# Patient Record
Sex: Male | Born: 1995 | Race: Black or African American | Hispanic: No | Marital: Single | Smoking: Never smoker
Health system: Southern US, Community
[De-identification: ages and names within clinical notes are randomized; demographics above are authoritative.]

## PROBLEM LIST (undated history)

## (undated) DIAGNOSIS — J45909 Unspecified asthma, uncomplicated: Secondary | ICD-10-CM

## (undated) DIAGNOSIS — J302 Other seasonal allergic rhinitis: Secondary | ICD-10-CM

## (undated) DIAGNOSIS — T7840XA Allergy, unspecified, initial encounter: Secondary | ICD-10-CM

## (undated) HISTORY — DX: Other seasonal allergic rhinitis: J30.2

## (undated) HISTORY — DX: Unspecified asthma, uncomplicated: J45.909

## (undated) HISTORY — DX: Allergy, unspecified, initial encounter: T78.40XA

---

## 2015-01-10 ENCOUNTER — Emergency Department (HOSPITAL_COMMUNITY)
Admission: EM | Admit: 2015-01-10 | Discharge: 2015-01-10 | Disposition: A | Discharge status: Home / Self Care | Payer: Managed Care, Other (non HMO) | Source: Home / Self Care | Attending: Family Medicine | Admitting: Family Medicine

## 2015-01-10 ENCOUNTER — Encounter (HOSPITAL_COMMUNITY): Payer: Self-pay | Admitting: Emergency Medicine

## 2015-01-10 DIAGNOSIS — R1084 Generalized abdominal pain: Secondary | ICD-10-CM

## 2015-01-10 DIAGNOSIS — R197 Diarrhea, unspecified: Secondary | ICD-10-CM | POA: Diagnosis not present

## 2015-01-10 NOTE — ED Provider Notes (Signed)
CSN: [id number]     Arrival date & time 01/10/15  0801 History   First MD Initiated Contact with Patient 01/10/15 [phone]     Chief Complaint  Patient presents with  . Abdominal Pain   (Consider location/radiation/quality/duration/timing/severity/associated sxs/prior Treatment) HPI          19 year old male presents for evaluation of abdominal pain and diarrhea. This started yesterday. Had mid abdominal pain and a few episodes of watery diarrhea. No nausea or vomiting, fever, or any other systemic symptoms. Pain was 8 out of 10 yesterday but is down to 5 out of 10 today. No blood in the diarrhea. No recent antibiotic use or hospitalizations. No medications taken for treatment. Denies urinary symptoms  History reviewed. No pertinent past medical history. History reviewed. No pertinent past surgical history. History reviewed. No pertinent family history. History  Substance Use Topics  . Smoking status: Never Smoker   . Smokeless tobacco: Not on file  . Alcohol Use: No    Review of Systems  Constitutional: Negative for fever.  Gastrointestinal: Positive for abdominal pain and diarrhea. Negative for nausea and vomiting.  All other systems reviewed and are negative.   Allergies  Review of patient's allergies indicates no known allergies.  Home Medications   Prior to Admission medications   Not on File   BP 116/82 mmHg  Pulse 79  Temp(Src) 97.7 F (36.5 C) (Oral)  Resp 16  SpO2 99% Physical Exam  Constitutional: He is oriented to person, place, and time. He appears well-developed and well-nourished. No distress.  HENT:  Head: Normocephalic.  Pulmonary/Chest: Effort normal. No respiratory distress.  Abdominal: Soft. Bowel sounds are normal. He exhibits no distension and no mass. There is tenderness ( very mild) in the periumbilical area. There is no rebound and no guarding.  Neurological: He is alert and oriented to person, place, and time. Coordination normal.  Skin: Skin is  warm and dry. No rash noted. He is not diaphoretic.  Psychiatric: He has a normal mood and affect. Judgment normal.  Nursing note and vitals reviewed.   ED Course  Procedures (including critical care time) Labs Review Labs Reviewed - No data to display  Imaging Review No results found.   MDM   1. Abdominal pain, generalized   2. Diarrhea    He is improving without treatment. Increase fluids, watchful waiting, he may start Imodium if the diarrhea gets worse. He will return if the pain gets any worse. For now, his abdomen is soft and minimally tender and he is in no distress, no further workup indicated at this time     Liam Graham, PA-C 01/10/15 [phone]

## 2015-01-10 NOTE — ED Notes (Signed)
C/o abdominal that is centered above the navel.  2 loose stools.  On set yesterday morning.  Denies n/v.   Pt has tried pepto with no relief. No fever.

## 2015-01-10 NOTE — Discharge Instructions (Signed)
Abdominal Pain °Many things can cause abdominal pain. Usually, abdominal pain is not caused by a disease and will improve without treatment. It can often be observed and treated at home. Your health care provider will do a physical exam and possibly order blood tests and X-rays to help determine the seriousness of your pain. However, in many cases, more time must pass before a clear cause of the pain can be found. Before that point, your health care provider may not know if you need more testing or further treatment. °HOME CARE INSTRUCTIONS  °Monitor your abdominal pain for any changes. The following actions may help to alleviate any discomfort you are experiencing: °· Only take over-the-counter or prescription medicines as directed by your health care provider. °· Do not take laxatives unless directed to do so by your health care provider. °· Try a clear liquid diet (broth, tea, or water) as directed by your health care provider. Slowly move to a bland diet as tolerated. °SEEK MEDICAL CARE IF: °· You have unexplained abdominal pain. °· You have abdominal pain associated with nausea or diarrhea. °· You have pain when you urinate or have a bowel movement. °· You experience abdominal pain that wakes you in the night. °· You have abdominal pain that is worsened or improved by eating food. °· You have abdominal pain that is worsened with eating fatty foods. °· You have a fever. °SEEK IMMEDIATE MEDICAL CARE IF:  °· Your pain does not go away within 2 hours. °· You keep throwing up (vomiting). °· Your pain is felt only in portions of the abdomen, such as the right side or the left lower portion of the abdomen. °· You pass bloody or black tarry stools. °MAKE SURE YOU: °· Understand these instructions.   °· Will watch your condition.   °· Will get help right away if you are not doing well or get worse.   °Document Released: 08/14/2005 Document Revised: 11/09/2013 Document Reviewed: 07/14/2013 °[employer]® Patient Information  ©2015 [employer]. This information is not intended to replace advice given to you by your health care provider. Make sure you discuss any questions you have with your health care provider. ° °Diarrhea °Diarrhea is frequent loose and watery bowel movements. It can cause you to feel weak and dehydrated. Dehydration can cause you to become tired and thirsty, have a dry mouth, and have decreased urination that often is dark yellow. Diarrhea is a sign of another problem, most often an infection that will not last long. In most cases, diarrhea typically lasts 2-3 days. However, it can last longer if it is a sign of something more serious. It is important to treat your diarrhea as directed by your caregiver to lessen or prevent future episodes of diarrhea. °CAUSES  °Some common causes include: °· Gastrointestinal infections caused by viruses, bacteria, or parasites. °· Food poisoning or food allergies. °· Certain medicines, such as antibiotics, chemotherapy, and laxatives. °· Artificial sweeteners and fructose. °· Digestive disorders. °HOME CARE INSTRUCTIONS °· Ensure adequate fluid intake (hydration): Have 1 cup (8 oz) of fluid for each diarrhea episode. Avoid fluids that contain simple sugars or sports drinks, fruit juices, whole milk products, and sodas. Your urine should be clear or pale yellow if you are drinking enough fluids. Hydrate with an oral rehydration solution that you can purchase at pharmacies, retail stores, and online. You can prepare an oral rehydration solution at home by mixing the following ingredients together: °¨  - tsp table salt. °¨ ¾ tsp baking soda. °¨    tsp salt substitute containing potassium chloride.  1  tablespoons sugar.  1 L (34 oz) of water.  Certain foods and beverages may increase the speed at which food moves through the gastrointestinal (GI) tract. These foods and beverages should be avoided and include:  Caffeinated and alcoholic beverages.  High-fiber foods, such as raw  fruits and vegetables, nuts, seeds, and whole grain breads and cereals.  Foods and beverages sweetened with sugar alcohols, such as xylitol, sorbitol, and mannitol.  Some foods may be well tolerated and may help thicken stool including:  Starchy foods, such as rice, toast, pasta, low-sugar cereal, oatmeal, grits, baked potatoes, crackers, and bagels.  Bananas.  Applesauce.  Add probiotic-rich foods to help increase healthy bacteria in the GI tract, such as yogurt and fermented milk products.  Wash your hands well after each diarrhea episode.  Only take over-the-counter or prescription medicines as directed by your caregiver.  Take a warm bath to relieve any burning or pain from frequent diarrhea episodes. SEEK IMMEDIATE MEDICAL CARE IF:   You are unable to keep fluids down.  You have persistent vomiting.  You have blood in your stool, or your stools are black and tarry.  You do not urinate in 6-8 hours, or there is only a small amount of very dark urine.  You have abdominal pain that increases or localizes.  You have weakness, dizziness, confusion, or light-headedness.  You have a severe headache.  Your diarrhea gets worse or does not get better.  You have a fever or persistent symptoms for more than 2-3 days.  You have a fever and your symptoms suddenly get worse. MAKE SURE YOU:   Understand these instructions.  Will watch your condition.  Will get help right away if you are not doing well or get worse. Document Released: 10/25/2002 Document Revised: 03/21/2014 Document Reviewed: 07/12/2012 [facility] Patient Information 2015 [place], [place]. This information is not intended to replace advice given to you by your health care provider. Make sure you discuss any questions you have with your health care provider.  Food Choices to Help Relieve Diarrhea When you have diarrhea, the foods you eat and your eating habits are very important. Choosing the right foods and drinks  can help relieve diarrhea. Also, because diarrhea can last up to 7 days, you need to replace lost fluids and electrolytes (such as sodium, potassium, and chloride) in order to help prevent dehydration.  WHAT GENERAL GUIDELINES DO I NEED TO FOLLOW?  Slowly drink 1 cup (8 oz) of fluid for each episode of diarrhea. If you are getting enough fluid, your urine will be clear or pale yellow.  Eat starchy foods. Some good choices include white rice, white toast, pasta, low-fiber cereal, baked potatoes (without the skin), saltine crackers, and bagels.  Avoid large servings of any cooked vegetables.  Limit fruit to two servings per day. A serving is  cup or 1 small piece.  Choose foods with less than 2 g of fiber per serving.  Limit fats to less than 8 tsp (38 g) per day.  Avoid fried foods.  Eat foods that have probiotics in them. Probiotics can be found in certain dairy products.  Avoid foods and beverages that may increase the speed at which food moves through the stomach and intestines (gastrointestinal tract). Things to avoid include:  High-fiber foods, such as dried fruit, raw fruits and vegetables, nuts, seeds, and whole grain foods.  Spicy foods and high-fat foods.  Foods and beverages sweetened with high-fructose  corn syrup, honey, or sugar alcohols such as xylitol, sorbitol, and mannitol. WHAT FOODS ARE RECOMMENDED? Grains White rice. White, [place], or pita breads (fresh or toasted), including plain rolls, buns, or bagels. White pasta. Saltine, soda, or graham crackers. Pretzels. Low-fiber cereal. Cooked cereals made with water (such as cornmeal, farina, or cream cereals). Plain muffins. Matzo. Melba toast. Zwieback.  Vegetables Potatoes (without the skin). Strained tomato and vegetable juices. Most well-cooked and canned vegetables without seeds. Tender lettuce. Fruits Cooked or canned applesauce, apricots, cherries, fruit cocktail, grapefruit, peaches, pears, or plums. Fresh  bananas, apples without skin, cherries, grapes, cantaloupe, grapefruit, peaches, oranges, or plums.  Meat and Other Protein Products Baked or boiled chicken. Eggs. Tofu. Fish. Seafood. Smooth peanut butter. Ground or well-cooked tender beef, ham, veal, lamb, pork, or poultry.  Dairy Plain yogurt, kefir, and unsweetened liquid yogurt. Lactose-free milk, buttermilk, or soy milk. Plain hard cheese. Beverages Sport drinks. Clear broths. Diluted fruit juices (except prune). Regular, caffeine-free sodas such as ginger ale. Water. Decaffeinated teas. Oral rehydration solutions. Sugar-free beverages not sweetened with sugar alcohols. Other Bouillon, broth, or soups made from recommended foods.  The items listed above may not be a complete list of recommended foods or beverages. Contact your dietitian for more options. WHAT FOODS ARE NOT RECOMMENDED? Grains Whole grain, whole wheat, bran, or rye breads, rolls, pastas, crackers, and cereals. Wild or brown rice. Cereals that contain more than 2 g of fiber per serving. Corn tortillas or taco shells. Cooked or dry oatmeal. Granola. Popcorn. Vegetables Raw vegetables. Cabbage, broccoli, Brussels sprouts, artichokes, baked beans, beet greens, corn, kale, legumes, peas, sweet potatoes, and yams. Potato skins. Cooked spinach and cabbage. Fruits Dried fruit, including raisins and dates. Raw fruits. Stewed or dried prunes. Fresh apples with skin, apricots, mangoes, pears, raspberries, and strawberries.  Meat and Other Protein Products Chunky peanut butter. Nuts and seeds. Beans and lentils. Berniece Salines.  Dairy High-fat cheeses. Milk, chocolate milk, and beverages made with milk, such as milk shakes. Cream. Ice cream. Sweets and Desserts Sweet rolls, doughnuts, and sweet breads. Pancakes and waffles. Fats and Oils Butter. Cream sauces. Margarine. Salad oils. Plain salad dressings. Olives. Avocados.  Beverages Caffeinated beverages (such as coffee, tea, soda, or  energy drinks). Alcoholic beverages. Fruit juices with pulp. Prune juice. Soft drinks sweetened with high-fructose corn syrup or sugar alcohols. Other Coconut. Hot sauce. Chili powder. Mayonnaise. Gravy. Cream-based or milk-based soups.  The items listed above may not be a complete list of foods and beverages to avoid. Contact your dietitian for more information. WHAT SHOULD I DO IF I BECOME DEHYDRATED? Diarrhea can sometimes lead to dehydration. Signs of dehydration include dark urine and dry mouth and skin. If you think you are dehydrated, you should rehydrate with an oral rehydration solution. These solutions can be purchased at pharmacies, retail stores, or online.  Drink -1 cup (120-240 mL) of oral rehydration solution each time you have an episode of diarrhea. If drinking this amount makes your diarrhea worse, try drinking smaller amounts more often. For example, drink 1-3 tsp (5-15 mL) every 5-10 minutes.  A general rule for staying hydrated is to drink 1-2 L of fluid per day. Talk to your health care provider about the specific amount you should be drinking each day. Drink enough fluids to keep your urine clear or pale yellow. Document Released: 01/25/2004 Document Revised: 11/09/2013 Document Reviewed: 09/27/2013 [facility] Patient Information 2015 [place], [place]. This information is not intended to replace advice given to you by your  health care provider. Make sure you discuss any questions you have with your health care provider. ° °

## 2016-02-24 ENCOUNTER — Encounter (HOSPITAL_COMMUNITY): Payer: Self-pay | Admitting: Emergency Medicine

## 2016-02-24 ENCOUNTER — Ambulatory Visit (HOSPITAL_COMMUNITY)
Admission: EM | Admit: 2016-02-24 | Discharge: 2016-02-24 | Disposition: A | Discharge status: Home / Self Care | Payer: Managed Care, Other (non HMO) | Attending: Family Medicine | Admitting: Family Medicine

## 2016-02-24 DIAGNOSIS — R059 Cough, unspecified: Secondary | ICD-10-CM

## 2016-02-24 DIAGNOSIS — R0982 Postnasal drip: Secondary | ICD-10-CM | POA: Diagnosis not present

## 2016-02-24 DIAGNOSIS — J3489 Other specified disorders of nose and nasal sinuses: Secondary | ICD-10-CM

## 2016-02-24 DIAGNOSIS — R05 Cough: Secondary | ICD-10-CM | POA: Diagnosis not present

## 2016-02-24 NOTE — ED Notes (Signed)
C/o cold sx onset x1 month associated w/cough and prod... Denies fevers... A&O x4... No acute distress.

## 2016-02-24 NOTE — ED Provider Notes (Signed)
CSN: [id number]     Arrival date & time 02/24/16  1802 History   First MD Initiated Contact with Patient 02/24/16 1942     Chief Complaint  Patient presents with  . URI   (Consider location/radiation/quality/duration/timing/severity/associated sxs/prior Treatment) HPI Comments: 20-year-old male complaining of a cough for one month. It is associated with PND. Denies fever, sore throat, shortness of breath, earache.  Patient is a 20 y.o. male presenting with URI.  URI Presenting symptoms: cough   Presenting symptoms: no congestion, no ear pain, no fatigue, no fever and no sore throat     History reviewed. No pertinent past medical history. History reviewed. No pertinent past surgical history. No family history on file. Social History  Substance Use Topics  . Smoking status: Never Smoker   . Smokeless tobacco: None  . Alcohol Use: No    Review of Systems  Constitutional: Negative for fever, chills, activity change and fatigue.  HENT: Positive for postnasal drip. Negative for congestion, ear discharge, ear pain, sinus pressure, sore throat and trouble swallowing.   Respiratory: Positive for cough. Negative for shortness of breath.   Cardiovascular: Negative.   Neurological: Negative.     Allergies  Review of patient's allergies indicates no known allergies.  Home Medications   Prior to Admission medications   Not on File   Meds Ordered and Administered this Visit  Medications - No data to display  BP 136/91 mmHg  Pulse 62  Temp(Src) 98.3 F (36.8 C) (Oral)  Resp 18  SpO2 100% No data found.   Physical Exam  Constitutional: He is oriented to person, place, and time. He appears well-developed and well-nourished. No distress.  HENT:  Mouth/Throat: No oropharyngeal exudate.  Bilateral TMs are normal Oropharynx with light scattered patches of erythema and scant clear PND. Patient is witnessed clearing his throat several times during the exam. No exudates or swelling.   Eyes: EOM are normal.  Neck: Normal range of motion. Neck supple.  Cardiovascular: Normal rate.   Pulmonary/Chest: Effort normal. No respiratory distress. He has no wheezes. He has no rales.  Musculoskeletal: He exhibits no edema.  Neurological: He is alert and oriented to person, place, and time. He exhibits normal muscle tone.  Skin: Skin is warm and dry.  Psychiatric: He has a normal mood and affect.  Nursing note and vitals reviewed.   ED Course  Procedures (including critical care time)  Labs Review Labs Reviewed - No data to display  Imaging Review No results found.   Visual Acuity Review  Right Eye Distance:   Left Eye Distance:   Bilateral Distance:    Right Eye Near:   Left Eye Near:    Bilateral Near:         MDM   1. Cough   2. PND (post-nasal drip)   3. Sinus drainage    Allergies Your cough is likely due to persistent drainage in the back of your throat. Recommend taking an antihistamine. The nondrowsy antihistamines include Zyrtec, Claritin and Allegra. If you need something stronger he may take Chlor-Trimeton 2 or 4 mg every 4 hours to help with drainage. This may cause some drowsiness. Using steroid nasal spray such as Nasacort or Flonase may also help with the drainage. Drink plenty of fluids, stay well-hydrated    Janne Napoleon, NP 02/24/16 1956

## 2016-02-24 NOTE — Discharge Instructions (Signed)
Allergies Your cough is likely due to persistent drainage in the back of your throat. Recommend taking an antihistamine. The nondrowsy antihistamines include Zyrtec, Claritin and Allegra. If you need something stronger he may take Chlor-Trimeton 2 or 4 mg every 4 hours to help with drainage. This may cause some drowsiness. Using steroid nasal spray such as Nasacort or Flonase may also help with the drainage. Drink plenty of fluids, stay well-hydrated An allergy is an abnormal reaction to a substance by the body's defense system (immune system). Allergies can develop at any age. WHAT CAUSES ALLERGIES? An allergic reaction happens when the immune system mistakenly reacts to a normally harmless substance, called an allergen, as if it were harmful. The immune system releases antibodies to fight the substance. Antibodies eventually release a chemical called histamine into the bloodstream. The release of histamine is meant to protect the body from infection, but it also causes discomfort. An allergic reaction can be triggered by:  Eating an allergen.  Inhaling an allergen.  Touching an allergen. WHAT TYPES OF ALLERGIES ARE THERE? There are many types of allergies. Common types include:  Seasonal allergies. People with this type of allergy are usually allergic to substances that are only present during certain seasons, such as molds and pollens.  Food allergies.  Drug allergies.  Insect allergies.  Animal dander allergies. WHAT ARE SYMPTOMS OF ALLERGIES? Possible allergy symptoms include:  Swelling of the lips, face, tongue, mouth, or throat.  Sneezing, coughing, or wheezing.  Nasal congestion.  Tingling in the mouth.  Rash.  Itching.  Itchy, red, swollen areas of skin (hives).  Watery eyes.  Vomiting.  Diarrhea.  Dizziness.  Lightheadedness.  Fainting.  Trouble breathing or swallowing.  Chest tightness.  Rapid heartbeat. HOW ARE ALLERGIES DIAGNOSED? Allergies  are diagnosed with a medical and family history and one or more of the following:  Skin tests.  Blood tests.  A food diary. A food diary is a record of all the foods and drinks you have in a day and of all the symptoms you experience.  The results of an elimination diet. An elimination diet involves eliminating foods from your diet and then adding them back in one by one to find out if a certain food causes an allergic reaction. HOW ARE ALLERGIES TREATED? There is no cure for allergies, but allergic reactions can be treated with medicine. Severe reactions usually need to be treated at a hospital. HOW CAN REACTIONS BE PREVENTED? The best way to prevent an allergic reaction is by avoiding the substance you are allergic to. Allergy shots and medicines can also help prevent reactions in some cases. People with severe allergic reactions may be able to prevent a life-threatening reaction called anaphylaxis with a medicine given right after exposure to the allergen.   This information is not intended to replace advice given to you by your health care provider. Make sure you discuss any questions you have with your health care provider.   Document Released: 01/28/2003 Document Revised: 11/25/2014 Document Reviewed: 08/16/2014 Elsevier Interactive Patient Education [employer].

## 2016-12-27 ENCOUNTER — Ambulatory Visit (INDEPENDENT_AMBULATORY_CARE_PROVIDER_SITE_OTHER): Payer: Managed Care, Other (non HMO) | Admitting: Internal Medicine

## 2016-12-27 ENCOUNTER — Encounter: Payer: Self-pay | Admitting: Internal Medicine

## 2016-12-27 ENCOUNTER — Other Ambulatory Visit (HOSPITAL_COMMUNITY)
Admission: RE | Admit: 2016-12-27 | Discharge: 2016-12-27 | Disposition: A | Discharge status: Home / Self Care | Payer: Managed Care, Other (non HMO) | Source: Ambulatory Visit | Attending: Family Medicine | Admitting: Family Medicine

## 2016-12-27 VITALS — BP 118/84 | HR 74 | Temp 98.0°F | Ht 67.4 in | Wt 147.2 lb

## 2016-12-27 DIAGNOSIS — J309 Allergic rhinitis, unspecified: Secondary | ICD-10-CM | POA: Insufficient documentation

## 2016-12-27 DIAGNOSIS — Z113 Encounter for screening for infections with a predominantly sexual mode of transmission: Secondary | ICD-10-CM | POA: Insufficient documentation

## 2016-12-27 DIAGNOSIS — J302 Other seasonal allergic rhinitis: Secondary | ICD-10-CM | POA: Diagnosis not present

## 2016-12-27 DIAGNOSIS — Z23 Encounter for immunization: Secondary | ICD-10-CM | POA: Diagnosis not present

## 2016-12-27 MED ORDER — FLUTICASONE PROPIONATE 50 MCG/ACT NA SUSP: 2.0000 | Freq: Every day | NASAL | 6 refills | Status: DC

## 2016-12-27 MED ORDER — CETIRIZINE HCL 10 MG PO TABS: 10.0000 mg | ORAL_TABLET | Freq: Every day | ORAL | 11 refills | Status: DC

## 2016-12-27 NOTE — Assessment & Plan Note (Signed)
Patient has had 23 male sexual partners in the last year. Denies any penile discharge, but notes "tingling" during urination a few weeks ago that resolved on its own. - Check HIV, RPR, gonorrhea, and chlamydia - Will call patient with results - Discussed the importance of using condoms with every sexual encounter

## 2016-12-27 NOTE — Patient Instructions (Addendum)
It was so nice to meet you!  I have prescribed Flonase (nasal spray) and Zyrtec. Please use the nasal spray daily if you can tolerate it. You should make sure you point the spray to the outside of your nostril. You should also take the Zyrtec daily.  I will call you with your lab results.  We will see you back in 1 year or earlier if needed.  -Dr. Brett Albino

## 2016-12-27 NOTE — Assessment & Plan Note (Signed)
Has had chronic congestion and postnasal drip for years. Worse when the seasons change. - Prescribed Flonase 2 sprays daily and Zyrtec 10mg  daily - Follow up if not improving

## 2016-12-27 NOTE — Progress Notes (Signed)
   [facility] Phone: [phone]  Subjective:  Antonio Decker is a 21 year old male presenting to clinic for a new patient appointment.   STD testing: Pt interested in having STD testing performed. See social history below. He denies any penile discharge or genital lesions. Had some tingling with urination a few weeks ago that resolved on its own.  Sinus Issues: Has been going on for years. Having congestion and post-nasal drip. Worse when seasons change. Has not tried any medications.  ROS: No shortness of breath, no chest pain, no abdominal pain, no lower extremity edema, no fevers, no chills.  Past Medical History- none  Past Surgical History- tonsillectomy at age 61, wisdom teeth at age 30  Family history-  -Maternal grandmother- diabetes, migraines -Paternal grandfather- cataracts -Father- migraines -Brother- asthma -Sister- asthma  Social history- Works at [employer] part-time, in the [employer], senior at [employer]. Sexually active with 5 male partners in the last year. Uses protection most of the time. Never smoker. Drinks alcohol 1-2 times per week has 2 drinks. No drug use.  Medications- none  Objective: BP 118/84 (BP Location: Right Arm, Patient Position: Sitting, Cuff Size: Normal)   Pulse 74   Temp 98 F (36.7 C) (Oral)   Ht 5' 7.4" (1.712 m)   Wt 147 lb 3.2 oz (66.8 kg)   SpO2 98%   BMI 22.78 kg/m  Gen: NAD, alert, cooperative with exam HEENT: NCAT, EOMI, MMM, TMs clear, oropharynx clear, nasal turbinates edematous Neck: FROM, supple, no cervical lymphadenopathy CV: RRR, no murmur Resp: CTABL, no wheezes, normal work of breathing GI: SNTND, BS present, no guarding or organomegaly Msk: No edema, warm, normal tone, moves UE/LE spontaneously Neuro: Alert and oriented, no gross deficits Skin: No rashes, no lesions Psych: Appropriate behavior  Assessment/Plan: Encounter for STD Screening: Patient has had 3 male  sexual partners in the last year. Denies any penile discharge, but notes "tingling" during urination a few weeks ago that resolved on its own. - Check HIV, RPR, gonorrhea, and chlamydia - Will call patient with results - Discussed the importance of using condoms with every sexual encounter  Allergic Rhinitis: Has had chronic congestion and postnasal drip for years. Worse when the seasons change. - Prescribed Flonase 2 sprays daily and Zyrtec 10mg  daily - Follow up if not improving   Hyman Bible, MD PGY-2

## 2016-12-28 LAB — HIV ANTIBODY (ROUTINE TESTING W REFLEX): HIV 1&2 Ab, 4th Generation: NONREACTIVE

## 2016-12-28 LAB — RPR

## 2016-12-30 LAB — URINE CYTOLOGY ANCILLARY ONLY
Chlamydia: POSITIVE — AB
Neisseria Gonorrhea: NEGATIVE

## 2016-12-31 ENCOUNTER — Telehealth: Payer: Self-pay | Admitting: Internal Medicine

## 2016-12-31 NOTE — Telephone Encounter (Signed)
Patient returning call to Dr. Brett Albino about test results.

## 2016-12-31 NOTE — Telephone Encounter (Signed)
Spoke with patient on the phone. He will call our clinic to schedule an appointment in nurse clinic.

## 2017-01-01 ENCOUNTER — Ambulatory Visit (INDEPENDENT_AMBULATORY_CARE_PROVIDER_SITE_OTHER): Payer: Managed Care, Other (non HMO) | Admitting: *Deleted

## 2017-01-01 DIAGNOSIS — A749 Chlamydial infection, unspecified: Secondary | ICD-10-CM

## 2017-01-01 MED ORDER — AZITHROMYCIN 500 MG PO TABS
1000.0000 mg | ORAL_TABLET | Freq: Once | ORAL | Status: AC
  Administered 2017-01-01: 1000 mg via ORAL

## 2017-01-01 NOTE — Progress Notes (Signed)
   Patient in nurse clinic for chlamydia treatment.  Patient advised no sex for 7-10 days or until partner has been tested/treated.  Patient to schedule a follow up visit in 2-3 months for re-screening.  Azithromycin 1 gm PO x 1 given; order by Dr. Brett Albino.  Communicable Disease Report form faxed to [facility].  Derl Barrow, RN

## 2017-09-01 ENCOUNTER — Encounter: Payer: Self-pay | Admitting: Physician Assistant

## 2017-09-01 ENCOUNTER — Ambulatory Visit (INDEPENDENT_AMBULATORY_CARE_PROVIDER_SITE_OTHER): Payer: Managed Care, Other (non HMO) | Admitting: Physician Assistant

## 2017-09-01 VITALS — BP 110/80 | HR 76 | Temp 98.3°F | Resp 16 | Ht 67.0 in | Wt 146.4 lb

## 2017-09-01 DIAGNOSIS — K439 Ventral hernia without obstruction or gangrene: Secondary | ICD-10-CM

## 2017-09-01 NOTE — Progress Notes (Signed)
   Antonio Decker  MRN: [id number] DOB: 1996-06-08  PCP: Dorise Hiss, PA-C  Subjective:  Pt is a 21 year old male who presents to clinic for abdominal pain x 3 days. Pain is in center of abdomen above belly button, "feels like something is poking out above my belly button." Pain is worse with movement. Has happened before in the same spot. "I ended up pushing on it and it went away".  Denies n/v, diarrhea, change in appetite, redness, swelling, fever, chills, increased temperature.  He is in the [employer] and has to do PT.   Review of Systems  Constitutional: Negative for chills and fever.  Cardiovascular: Negative for palpitations.  Gastrointestinal: Positive for abdominal pain. Negative for constipation, diarrhea, nausea and vomiting.  Skin: Negative for color change.    There are no active problems to display for this patient.   No current outpatient prescriptions on file prior to visit.   No current facility-administered medications on file prior to visit.     No Known Allergies   Objective:  BP 110/80   Pulse 76   Temp 98.3 F (36.8 C) (Oral)   Resp 16   Ht 5\' 7"  (1.702 m)   Wt 146 lb 6.4 oz (66.4 kg)   SpO2 98%   BMI 22.93 kg/m   Physical Exam  Constitutional: He is oriented to person, place, and time and well-developed, well-nourished, and in no distress. No distress.  Abdominal: Soft. Normal appearance and bowel sounds are normal. A hernia is present.    Neurological: He is alert and oriented to person, place, and time. GCS score is 15.  Skin: Skin is warm and dry.  Psychiatric: Mood, memory, affect and judgment normal.  Vitals reviewed.   Assessment and Plan :  1. Hernia of abdominal wall - Ambulatory referral to General Surgery - Unable to reduce abdominal hernia. No sign of incarceration. Advised pt to procede to the emergency department if symptoms worsen. 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   Mercer Pod,  PA-C  [facility] 09/01/2017 10:38 AM

## 2017-09-01 NOTE — Patient Instructions (Signed)
     IF you received an x-ray today, you will receive an invoice from [facility]. Please contact [facility] at [phone] with questions or concerns regarding your invoice.   IF you received labwork today, you will receive an invoice from [facility]. Please contact [facility] at [phone] with questions or concerns regarding your invoice.   Our billing staff will not be able to assist you with questions regarding bills from these companies.  You will be contacted with the lab results as soon as they are available. The fastest way to get your results is to activate your My Chart account. Instructions are located on the last page of this paperwork. If you have not heard from us regarding the results in 2 weeks, please contact this office.     

## 2017-11-19 ENCOUNTER — Other Ambulatory Visit: Payer: Self-pay

## 2017-11-19 ENCOUNTER — Encounter: Payer: Self-pay | Admitting: Physician Assistant

## 2017-11-19 ENCOUNTER — Ambulatory Visit: Payer: BC Managed Care – PPO | Admitting: Physician Assistant

## 2017-11-19 VITALS — BP 120/86 | HR 67 | Temp 98.7°F | Resp 18 | Ht 69.09 in | Wt 149.6 lb

## 2017-11-19 DIAGNOSIS — J0100 Acute maxillary sinusitis, unspecified: Secondary | ICD-10-CM

## 2017-11-19 DIAGNOSIS — J309 Allergic rhinitis, unspecified: Secondary | ICD-10-CM | POA: Diagnosis not present

## 2017-11-19 MED ORDER — AMOXICILLIN-POT CLAVULANATE 875-125 MG PO TABS: 1.0000 | ORAL_TABLET | Freq: Two times a day (BID) | ORAL | 0 refills | Status: AC

## 2017-11-19 MED ORDER — CETIRIZINE HCL 10 MG PO TABS: 10.0000 mg | ORAL_TABLET | Freq: Every day | ORAL | 11 refills | Status: DC

## 2017-11-19 MED ORDER — PSEUDOEPHEDRINE HCL 60 MG PO TABS: 60.0000 mg | ORAL_TABLET | Freq: Four times a day (QID) | ORAL | 0 refills | Status: DC | PRN

## 2017-11-19 NOTE — Patient Instructions (Addendum)
For sinus issues, I recommend you do daily Zyrtec and Flonase.  For the times when he starts to feel increased head pressure, please use Sudafed as prescribed.  Sudafed may increase your heart rate, pressure, and cause insomnia.  Due to the fact that you are having worsening sinus pressure, we are going to treat empirically for underlying sinus infection.  Please take antibiotics as prescribed.  Continue with dailySudafed for the next few days while on the antibiotic.  I also recommend doing nasal saline rinses.  For the future, I have given you a referral for allergy specialist to have allergy testing.   they should contact you within 1-2 weeks.  If you do not hear from them by then please contact our office. Thank you for letting me participate in your health and well being.  Sinusitis, Adult Sinusitis is soreness and inflammation of your sinuses. Sinuses are hollow spaces in the bones around your face. They are located:  Around your eyes.  In the middle of your forehead.  Behind your nose.  In your cheekbones.  Your sinuses and nasal passages are lined with a stringy fluid (mucus). Mucus normally drains out of your sinuses. When your nasal tissues get inflamed or swollen, the mucus can get trapped or blocked so air cannot flow through your sinuses. This lets bacteria, viruses, and funguses grow, and that leads to infection. Follow these instructions at home: Medicines  Take, use, or apply over-the-counter and prescription medicines only as told by your doctor. These may include nasal sprays.  If you were prescribed an antibiotic medicine, take it as told by your doctor. Do not stop taking the antibiotic even if you start to feel better. Hydrate and Humidify  Drink enough water to keep your pee (urine) clear or pale yellow.  Use a cool mist humidifier to keep the humidity level in your home above 50%.  Breathe in steam for 10-15 minutes, 3-4 times a day or as told by your doctor. You can  do this in the bathroom while a hot shower is running.  Try not to spend time in cool or dry air. Rest  Rest as much as possible.  Sleep with your head raised (elevated).  Make sure to get enough sleep each night. General instructions  Put a warm, moist washcloth on your face 3-4 times a day or as told by your doctor. This will help with discomfort.  Wash your hands often with soap and water. If there is no soap and water, use hand sanitizer.  Do not smoke. Avoid being around people who are smoking (secondhand smoke).  Keep all follow-up visits as told by your doctor. This is important. Contact a doctor if:  You have a fever.  Your symptoms get worse.  Your symptoms do not get better within 10 days. Get help right away if:  You have a very bad headache.  You cannot stop throwing up (vomiting).  You have pain or swelling around your face or eyes.  You have trouble seeing.  You feel confused.  Your neck is stiff.  You have trouble breathing. This information is not intended to replace advice given to you by your health care provider. Make sure you discuss any questions you have with your health care provider. Document Released: 04/22/2008 Document Revised: 06/30/2016 Document Reviewed: 08/30/2015 Elsevier Interactive Patient Education  2018 [employer].   IF you received an x-ray today, you will receive an invoice from [facility]. Please contact [facility] at [phone]  with questions or concerns regarding your invoice.   IF you received labwork today, you will receive an invoice from [facility]. Please contact [facility] at [phone] with questions or concerns regarding your invoice.   Our billing staff will not be able to assist you with questions regarding bills from these companies.  You will be contacted with the lab results as soon as they are available. The fastest way to get your results is to activate your My Chart account.  Instructions are located on the last page of this paperwork. If you have not heard from [place] regarding the results in 2 weeks, please contact this office.

## 2017-11-19 NOTE — Progress Notes (Signed)
MRN: [id number] DOB: [birth year]  Subjective:   Antonio Decker is a 22 y.o. male presenting for chief complaint of Sinus Problem (X 2 mth- off and on) and Nasal Congestion (X 2 weeks) .  Reports 2 month history of sinus issues. He is used to having nasal congestion daily but notes it has been worse over the past two months and especially worse over the past 2 weeks. He is having yellow mucous production from nostrils, sinus pressure, post nasal drip causing him to have a dry cough occassionally, and head fullness. Has tried zyrtec and occassional flonase with no full relief. Denies fever, ear pain, sore throat, wheezing, shortness of breath, chest tightness, chest pain, myalgia, chills, nausea, vomiting, abdominal pain, diarrhea, confusion, and visual disturbance. Has not had sick contact with anyone. Has history of yearly allergies and notes they have never been well controlled. He has never seen an allergy specialist. Notes the only change in his environment over the past few months is that his office has a leak in it. No history of asthma. Patient has had flu shot this season. Denies smoking. Denies any other aggravating or relieving factors, no other questions or concerns.  Jakeb has a current medication list which includes the following prescription(s): fluticasone, fluticasone, amoxicillin-clavulanate, cetirizine, montelukast, and pseudoephedrine. Also has No Known Allergies.  Cristian  has a past medical history of Seasonal allergies. Also  has no past surgical history on file.   Objective:   Vitals: BP 120/86 (BP Location: Left Arm, Patient Position: Sitting, Cuff Size: Normal)   Pulse 67   Temp 98.7 F (37.1 C) (Oral)   Resp 18   Ht 5' 9.09" (1.755 m)   Wt 149 lb 9.6 oz (67.9 kg)   SpO2 99%   BMI 22.03 kg/m   Physical Exam  Constitutional: He is oriented to person, place, and time. He appears well-developed and well-nourished. No distress.  HENT:  Head: Normocephalic and  atraumatic.  Right Ear: External ear and ear canal normal. Tympanic membrane is not erythematous and not bulging. A middle ear effusion is present.  Left Ear: External ear and ear canal normal. Tympanic membrane is not erythematous and not bulging. A middle ear effusion is present.  Nose: Mucosal edema (severe bilatearlly) present. No rhinorrhea. Right sinus exhibits maxillary sinus tenderness. Right sinus exhibits no frontal sinus tenderness. Left sinus exhibits maxillary sinus tenderness. Left sinus exhibits no frontal sinus tenderness.  Mouth/Throat: Uvula is midline, oropharynx is clear and moist and mucous membranes are normal. No tonsillar exudate.  Eyes: Conjunctivae are normal.  Neck: Normal range of motion.  Cardiovascular: Normal rate, regular rhythm and normal heart sounds.  Pulmonary/Chest: Effort normal and breath sounds normal. He has no wheezes. He has no rhonchi. He has no rales.  Lymphadenopathy:       Head (right side): No submental, no submandibular, no tonsillar, no preauricular, no posterior auricular and no occipital adenopathy present.       Head (left side): No submental, no submandibular, no tonsillar, no preauricular, no posterior auricular and no occipital adenopathy present.    He has no cervical adenopathy.       Right: No supraclavicular adenopathy present.       Left: No supraclavicular adenopathy present.  Neurological: He is alert and oriented to person, place, and time.  Skin: Skin is warm and dry.  Psychiatric: He has a normal mood and affect.  Vitals reviewed.   No results found for this or any previous visit (  from the past 24 hour(s)).  Assessment and Plan :  1. Allergic rhinitis, unspecified seasonality, unspecified trigger Due to extensive history of allergies, will refer to allergy specialist for patch testing.  Patient encouraged to continue Zyrtec and Flonase daily for better control of allergies.  May use Sudafed when he begins to get worsening  sinus pressure. - cetirizine (ZYRTEC) 10 MG tablet; Take 1 tablet (10 mg total) by mouth daily.  Dispense: 30 tablet; Refill: 11 - Ambulatory referral to Allergy  2. Acute maxillary sinusitis, recurrence not specified Due to duration of symptoms and no improvement with symptomatic treatment, will treat for underlying bacterial etiology at this time.  Patient encouraged to start nasal saline rinses and sudafed and continue with Zyrtec. Advised to return to clinic if symptoms worsen, do not improve, or as needed. - amoxicillin-clavulanate (AUGMENTIN) 875-125 MG tablet; Take 1 tablet by mouth 2 (two) times daily for 7 days.  Dispense: 14 tablet; Refill: 0 - pseudoephedrine (SUDAFED) 60 MG tablet; Take 1 tablet (60 mg total) by mouth every 6 (six) hours as needed for congestion.  Dispense: 30 tablet; Refill: 0  Tenna Delaine, PA-C  Primary [facility] at [facility] 11/19/2017 10:13 AM

## 2018-01-01 ENCOUNTER — Ambulatory Visit (INDEPENDENT_AMBULATORY_CARE_PROVIDER_SITE_OTHER): Payer: BC Managed Care – PPO | Admitting: Allergy & Immunology

## 2018-01-01 ENCOUNTER — Encounter: Payer: Self-pay | Admitting: Allergy & Immunology

## 2018-01-01 VITALS — BP 108/64 | HR 74 | Ht 67.0 in | Wt 149.6 lb

## 2018-01-01 DIAGNOSIS — J302 Other seasonal allergic rhinitis: Secondary | ICD-10-CM

## 2018-01-01 DIAGNOSIS — J3089 Other allergic rhinitis: Secondary | ICD-10-CM | POA: Diagnosis not present

## 2018-01-01 DIAGNOSIS — J4520 Mild intermittent asthma, uncomplicated: Secondary | ICD-10-CM | POA: Diagnosis not present

## 2018-01-01 MED ORDER — OLOPATADINE HCL 0.6 % NA SOLN: 2.0000 | Freq: Two times a day (BID) | NASAL | 5 refills | Status: DC

## 2018-01-01 MED ORDER — FEXOFENADINE HCL 180 MG PO TABS: 180.0000 mg | ORAL_TABLET | Freq: Every day | ORAL | 5 refills | Status: DC

## 2018-01-01 MED ORDER — FLUTICASONE PROPIONATE 50 MCG/ACT NA SUSP: 2.0000 | Freq: Every day | NASAL | 5 refills | Status: DC

## 2018-01-01 NOTE — Patient Instructions (Addendum)
1. Mild intermittent asthma, uncomplicated - Lung function looked fairly normal today. - There does not seem to be a need for a daily controller medication at this time, but we can readdress this as time goes on. - In the meantime, continue with albuterol four puffs every 4-6 hours as needed for coughing/wheezing/shortness of breath.  2. Seasonal and perennial allergic rhinitis - Testing today showed: weeds, outdoor molds and dust mites - Avoidance measures provided. - Stop taking: Claritin - Start taking: Allegra (fexofenadine) 180mg  table once daily, Flonase (fluticasone) two sprays per nostril daily and Patanase (olopatadine) two sprays per nostril 1-2 times daily as needed - You can use an extra dose of the antihistamine, if needed, for breakthrough symptoms.  - Consider nasal saline rinses 1-2 times daily to remove allergens from the nasal cavities as well as help with mucous clearance (this is especially helpful to do before the nasal sprays are given) - Consider allergy shots as a means of long-term control. - Allergy shots "re-train" and "reset" the immune system to ignore environmental allergens and decrease the resulting immune response to those allergens (sneezing, itchy watery eyes, runny nose, nasal congestion, etc).    - Allergy shots improve symptoms in 75-85% of patients.  - We can discuss more at the next appointment if the medications are not working for you.  3. Return in about 3 months (around 03/31/2018).   Please inform us of any Emergency Department visits, hospitalizations, or changes in symptoms. Call us before going to the ED for breathing or allergy symptoms since we might be able to fit you in for a sick visit. Feel free to contact us anytime with any questions, problems, or concerns.  It was a pleasure to meet you today! Happy Valentine's Day!   Websites that have reliable patient information: 1. American Academy of Asthma, Allergy, and Immunology:  www.aaaai.org 2. Food Allergy Research and Education (FARE): foodallergy.org 3. Mothers of Asthmatics: http://www.asthmacommunitynetwork.org 4. American College of Allergy, Asthma, and Immunology: www.acaai.org  Reducing Pollen Exposure  The American Academy of Allergy, Asthma and Immunology suggests the following steps to reduce your exposure to pollen during allergy seasons.    1. Do not hang sheets or clothing out to dry; pollen may collect on these items. 2. Do not mow lawns or spend time around freshly cut grass; mowing stirs up pollen. 3. Keep windows closed at night.  Keep car windows closed while driving. 4. Minimize morning activities outdoors, a time when pollen counts are usually at their highest. 5. Stay indoors as much as possible when pollen counts or humidity is high and on windy days when pollen tends to remain in the air longer. 6. Use air conditioning when possible.  Many air conditioners have filters that trap the pollen spores. 7. Use a HEPA room air filter to remove pollen form the indoor air you breathe.  Control of Mold Allergen   Mold and fungi can grow on a variety of surfaces provided certain temperature and moisture conditions exist.  Outdoor molds grow on plants, decaying vegetation and soil.  The major outdoor mold, Alternaria and Cladosporium, are found in very high numbers during hot and dry conditions.  Generally, a late Summer - Fall peak is seen for common outdoor fungal spores.  Rain will temporarily lower outdoor mold spore count, but counts rise rapidly when the rainy period ends.  The most important indoor molds are Aspergillus and Penicillium.  Dark, humid and poorly ventilated basements are ideal sites for mold  growth.  The next most common sites of mold growth are the bathroom and the kitchen.  Outdoor (Seasonal) Mold Control  Positive outdoor molds via skin testing: Bipolaris (Helminthsporium), Drechslera (Curvalaria) and Mucor  1. Use air  conditioning and keep windows closed 2. Avoid exposure to decaying vegetation. 3. Avoid leaf raking. 4. Avoid grain handling. 5. Consider wearing a face mask if working in moldy areas.  6.   Control of House Dust Mite Allergen    House dust mites play a major role in allergic asthma and rhinitis.  They occur in environments with high humidity wherever human skin, the food for dust mites is found. High levels have been detected in dust obtained from mattresses, pillows, carpets, upholstered furniture, bed covers, clothes and soft toys.  The principal allergen of the house dust mite is found in its feces.  A gram of dust may contain 1,000 mites and 250,000 fecal particles.  Mite antigen is easily measured in the air during house cleaning activities.    1. Encase mattresses, including the box spring, and pillow, in an air tight cover.  Seal the zipper end of the encased mattresses with wide adhesive tape. 2. Wash the bedding in water of 130 degrees Farenheit weekly.  Avoid cotton comforters/quilts and flannel bedding: the most ideal bed covering is the dacron comforter. 3. Remove all upholstered furniture from the bedroom. 4. Remove carpets, carpet padding, rugs, and non-washable window drapes from the bedroom.  Wash drapes weekly or use plastic window coverings. 5. Remove all non-washable stuffed toys from the bedroom.  Wash stuffed toys weekly. 6. Have the room cleaned frequently with a vacuum cleaner and a damp dust-mop.  The patient should not be in a room which is being cleaned and should wait 1 hour after cleaning before going into the room. 7. Close and seal all heating outlets in the bedroom.  Otherwise, the room will become filled with dust-laden air.  An electric heater can be used to heat the room. 8. Reduce indoor humidity to less than 50%.  Do not use a humidifier.    Allergy Shots   Allergies are the result of a chain reaction that starts in the immune system. Your immune system  controls how your body defends itself. For instance, if you have an allergy to pollen, your immune system identifies pollen as an invader or allergen. Your immune system overreacts by producing antibodies called Immunoglobulin E (IgE). These antibodies travel to cells that release chemicals, causing an allergic reaction.  The concept behind allergy immunotherapy, whether it is received in the form of shots or tablets, is that the immune system can be desensitized to specific allergens that trigger allergy symptoms. Although it requires time and patience, the payback can be long-term relief.  How Do Allergy Shots Work?  Allergy shots work much like a vaccine. Your body responds to injected amounts of a particular allergen given in increasing doses, eventually developing a resistance and tolerance to it. Allergy shots can lead to decreased, minimal or no allergy symptoms.  There generally are two phases: build-up and maintenance. Build-up often ranges from three to six months and involves receiving injections with increasing amounts of the allergens. The shots are typically given once or twice a week, though more rapid build-up schedules are sometimes used.  The maintenance phase begins when the most effective dose is reached. This dose is different for each person, depending on how allergic you are and your response to the build-up injections. Once the  maintenance dose is reached, there are longer periods between injections, typically two to four weeks.  Occasionally doctors give cortisone-type shots that can temporarily reduce allergy symptoms. These types of shots are different and should not be confused with allergy immunotherapy shots.  Who Can Be Treated with Allergy Shots?  Allergy shots may be a good treatment approach for people with allergic rhinitis (hay fever), allergic asthma, conjunctivitis (eye allergy) or stinging insect allergy.   Before deciding to begin allergy shots, you should  consider:  . The length of allergy season and the severity of your symptoms . Whether medications and/or changes to your environment can control your symptoms . Your desire to avoid long-term medication use . Time: allergy immunotherapy requires a major time commitment . Cost: may vary depending on your insurance coverage  Allergy shots for children age 98 and older are effective and often well tolerated. They might prevent the onset of new allergen sensitivities or the progression to asthma.  Allergy shots are not started on patients who are pregnant but can be continued on patients who become pregnant while receiving them. In some patients with other medical conditions or who take certain common medications, allergy shots may be of risk. It is important to mention other medications you talk to your allergist.   When Will I Feel Better?  Some may experience decreased allergy symptoms during the build-up phase. For others, it may take as long as 12 months on the maintenance dose. If there is no improvement after a year of maintenance, your allergist will discuss other treatment options with you.  If you aren't responding to allergy shots, it may be because there is not enough dose of the allergen in your vaccine or there are missing allergens that were not identified during your allergy testing. Other reasons could be that there are high levels of the allergen in your environment or major exposure to non-allergic triggers like tobacco smoke.  What Is the Length of Treatment?  Once the maintenance dose is reached, allergy shots are generally continued for three to five years. The decision to stop should be discussed with your allergist at that time. Some people may experience a permanent reduction of allergy symptoms. Others may relapse and a longer course of allergy shots can be considered.  What Are the Possible Reactions?  The two types of adverse reactions that can occur with allergy  shots are local and systemic. Common local reactions include very mild redness and swelling at the injection site, which can happen immediately or several hours after. A systemic reaction, which is less common, affects the entire body or a particular body system. They are usually mild and typically respond quickly to medications. Signs include increased allergy symptoms such as sneezing, a stuffy nose or hives.  Rarely, a serious systemic reaction called anaphylaxis can develop. Symptoms include swelling in the throat, wheezing, a feeling of tightness in the chest, nausea or dizziness. Most serious systemic reactions develop within 30 minutes of allergy shots. This is why it is strongly recommended you wait in your doctor's office for 30 minutes after your injections. Your allergist is trained to watch for reactions, and his or her staff is trained and equipped with the proper medications to identify and treat them.  Who Should Administer Allergy Shots?  The preferred location for receiving shots is your prescribing allergist's office. Injections can sometimes be given at another facility where the physician and staff are trained to recognize and treat reactions, and have received  instructions by your prescribing allergist.  Delma Freeze

## 2018-01-01 NOTE — Progress Notes (Signed)
NEW PATIENT  Date of Service/Encounter:  01/01/18  Referring provider: Dorise Hiss, PA-C   Assessment:   Mild intermittent asthma, uncomplicated  Seasonal and perennial allergic rhinitis (dust mites, weed, ragweed, outdoor molds)   Plan/Recommendations:   1. Mild intermittent asthma, uncomplicated - Lung function looked fairly normal today. - There does not seem to be a need for a daily controller medication at this time, but we can readdress this as time goes on. - In the meantime, continue with albuterol four puffs every 4-6 hours as needed for coughing/wheezing/shortness of breath.  2. Seasonal and perennial allergic rhinitis - Testing today showed: weeds, outdoor molds and dust mites - Avoidance measures provided. - Stop taking: Claritin - Start taking: Allegra (fexofenadine) 180mg  table once daily, Flonase (fluticasone) two sprays per nostril daily and Patanase (olopatadine) two sprays per nostril 1-2 times daily as needed - You can use an extra dose of the antihistamine, if needed, for breakthrough symptoms.  - Consider nasal saline rinses 1-2 times daily to remove allergens from the nasal cavities as well as help with mucous clearance (this is especially helpful to do before the nasal sprays are given) - Consider allergy shots as a means of long-term control. - Allergy shots "re-train" and "reset" the immune system to ignore environmental allergens and decrease the resulting immune response to those allergens (sneezing, itchy watery eyes, runny nose, nasal congestion, etc).    - Allergy shots improve symptoms in 75-85% of patients.  - We can discuss more at the next appointment if the medications are not working for you.  3. Return in about 3 months (around 03/31/2018).   Subjective:   Antonio Decker is a 22 y.o. male presenting today for evaluation of  Chief Complaint  Patient presents with  . Allergy Testing    Pt presents to have allergy testing.      Antonio Decker has a history of the following: Patient Active Problem List   Diagnosis Date Noted  . Mild intermittent asthma, uncomplicated 24/26/8341  . Seasonal and perennial allergic rhinitis 01/01/2018  . Screening for STD (sexually transmitted disease) 12/27/2016  . Allergic rhinitis due to allergen 12/27/2016    History obtained from: chart review and patient.  Antonio Decker was referred by Dorise Hiss, PA-C.      Antonio Decker is a 22 y.o. male presenting for an allergy evaluation. He reports year round congestion for years, but now it is getting worse. He does endorse postnasal drip with hoarseness and coughing. He is unsure whether it gets better in the winter. It does not seem to get much worse in the spring but his ocular symptoms definitely get worse. He has tried using Flonase and Singulair.  He has also tried Claritin. All provided some but no entire relief. He has never been allergy tested. He is originally from [place], [place]. He has lived here for 5-6 years after attending [employer].   He does have a remote history of asthma. He has had no symptoms since he was a child. He was hospitalized as a toddler, but nothing that he can remember at all. He was on a controller at some point - likely Advair Diskus from his description. He has not had that since Elementary school, however. He had not needed prednisone for ten or more years.    He is able to tolerate all allergenic foods without adverse effect. He has no problems with eczema or urticaria. Otherwise, there is no history of other atopic diseases, including  asthma, drug allergies, food allergies, stinging insect allergies, or urticaria. There is no significant infectious history. Vaccinations are up to date.   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 He works in [employer] but lives in [place].    Past Medical History: Patient Active Problem List   Diagnosis Date Noted   . Mild intermittent asthma, uncomplicated 42/59/[birth year]38  . Seasonal and perennial allergic rhinitis 01/01/2018  . Screening for STD (sexually transmitted disease) 12/27/2016  . Allergic rhinitis due to allergen 12/27/2016    Medication List:  Allergies as of 01/01/2018   No Known Allergies     Medication List        Accurate as of 01/01/18 10:11 AM. Always use your most recent med list.          cetirizine 10 MG tablet Commonly known as:  ZYRTEC Take 1 tablet (10 mg total) by mouth daily.   fluticasone 50 MCG/ACT nasal spray Commonly known as:  FLONASE Place 2 sprays into both nostrils daily.   pseudoephedrine 60 MG tablet Commonly known as:  SUDAFED Take 1 tablet (60 mg total) by mouth every 6 (six) hours as needed for congestion.       Birth History: non-contributory.   Developmental History: non-contributory.   Past Surgical History: History reviewed. No pertinent surgical history.   Family History: Family History  Problem Relation Age of Onset  . Diabetes Maternal Grandmother   . Migraines Maternal Grandmother   . Migraines Father   . Asthma Sister   . Asthma Brother   . Cataracts Paternal Grandmother      Social History: [name] lives at home with his four roommates. There are two dogs in the home. 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 They live in a town home. There are hardwoods throughout with linoleum in other rooms. There is gas heating with central cooling. There are dogs in the home. There are no dust mite coverings on the bedding. There is no tobacco exposure in the home.  He has worked in his current job for 3-4 months but plans to move onto other jobs in the future.    Review of Systems: a 14-point review of systems is pertinent for what is mentioned in HPI.  Otherwise, all other systems were negative. Constitutional: negative other than that listed in the HPI Eyes: negative  other than that listed in the HPI Ears, nose, mouth, throat, and face: negative other than that listed in the HPI Respiratory: negative other than that listed in the HPI Cardiovascular: negative other than that listed in the HPI Gastrointestinal: negative other than that listed in the HPI Genitourinary: negative other than that listed in the HPI Integument: negative other than that listed in the HPI Hematologic: negative other than that listed in the HPI Musculoskeletal: negative other than that listed in the HPI Neurological: negative other than that listed in the HPI Allergy/Immunologic: negative other than that listed in the HPI    Objective:   Blood pressure 108/64, pulse 74, height 5\' 7"  (1.702 m), weight 149 lb 9.6 oz (67.9 kg), SpO2 98 %. Body mass index is 23.43 kg/m.   Physical Exam:  General: Alert, interactive, in no acute distress. Well built male. Friendly.  Eyes: No conjunctival injection bilaterally, no discharge on the right, no discharge on the left and no Horner-Trantas dots present. PERRL bilaterally. EOMI without pain. No  photophobia.  Ears: Right TM pearly gray with normal light reflex, Left TM pearly gray with normal light reflex, Right TM intact without perforation and Left TM intact without perforation.  Nose/Throat: External nose within normal limits, nasal crease present and septum midline. Turbinates edematous and pale with clear discharge. Posterior oropharynx erythematous without cobblestoning in the posterior oropharynx. Tonsils 2+ without exudates.  Tongue without thrush. Neck: Supple without thyromegaly. Trachea midline. Adenopathy: no enlarged lymph nodes appreciated in the anterior cervical, occipital, axillary, epitrochlear, inguinal, or popliteal regions. Lungs: Clear to auscultation without wheezing, rhonchi or rales. No increased work of breathing. CV: Normal S1/S2. No murmurs. Capillary refill <2 seconds.  Abdomen: Nondistended, nontender. No  guarding or rebound tenderness. Bowel sounds present in all fields and hypoactive  Skin: Warm and dry, without lesions or rashes. Extremities:  No clubbing, cyanosis or edema. Neuro:   Grossly intact. No focal deficits appreciated. Responsive to questions.  Diagnostic studies:   Spirometry: results normal (FEV1: 2.83/79%, FVC: 2.96/70%, FEV1/FVC: 96%).    Spirometry consistent with possible restriction. However, he is having no symptoms of uncontrolled asthma, therefore we deferred further workup of this.   Allergy Studies:   Indoor/Outdoor Percutaneous Adult Environmental Panel: positive to Df mite and Dp mites. Otherwise negative with adequate controls.  Indoor/Outdoor Selected Intradermal Environmental Panel: positive to ragweed mix, weed mix and mold mix #3. Otherwise negative with adequate controls.   Allergy testing results were read and interpreted by myself, documented by clinical staff.     Salvatore Marvel, MD [facility]

## 2018-03-06 ENCOUNTER — Other Ambulatory Visit: Payer: Self-pay

## 2018-03-06 ENCOUNTER — Ambulatory Visit: Payer: BC Managed Care – PPO | Admitting: Family Medicine

## 2018-03-06 ENCOUNTER — Encounter: Payer: Self-pay | Admitting: Family Medicine

## 2018-03-06 VITALS — BP 102/78 | HR 33 | Temp 98.3°F | Resp 16 | Ht 68.5 in | Wt 146.0 lb

## 2018-03-06 DIAGNOSIS — L609 Nail disorder, unspecified: Secondary | ICD-10-CM | POA: Diagnosis not present

## 2018-03-06 NOTE — Progress Notes (Signed)
Subjective:  By signing my name below, I, Antonio Decker, attest that this documentation has been prepared under the direction and in the presence of Wendie Agreste, MD Electronically Signed: Ladene Artist, ED Scribe 03/06/2018 at 11:29 AM.   Patient ID: Antonio Decker, male    DOB: February 01, 1996, 22 y.o.   MRN: [id number]  Chief Complaint  Patient presents with  . Nail Problem    pt states he has had this black spot on his right big toe for a while and just wanted to get it examined   HPI Antonio Decker is a 22 y.o. male who presents to Primary Care at [facility] complaining of a gradually enlarging dark spot on his R great toenail over the past few yrs. Pt states that the area begin as a light colored thin band that is not growing out. Denies pain, itching or injury. He has not seen dermatology.  Patient Active Problem List   Diagnosis Date Noted  . Mild intermittent asthma, uncomplicated 58/52/7782  . Seasonal and perennial allergic rhinitis 01/01/2018  . Screening for STD (sexually transmitted disease) 12/27/2016  . Allergic rhinitis due to allergen 12/27/2016   Past Medical History:  Diagnosis Date  . Seasonal allergies    History reviewed. No pertinent surgical history. No Known Allergies Prior to Admission medications   Medication Sig Start Date End Date Taking? Authorizing Provider  cetirizine (ZYRTEC) 10 MG tablet Take 1 tablet (10 mg total) by mouth daily. 11/19/17   Leonie Douglas, PA-C  fexofenadine (ALLEGRA ALLERGY) 180 MG tablet Take 1 tablet (180 mg total) by mouth daily. 01/01/18 01/31/18  Valentina Shaggy, MD  fluticasone [facility]) 50 MCG/ACT nasal spray Place 2 sprays into both nostrils daily. 01/01/18 01/31/18  Valentina Shaggy, MD  Olopatadine HCl 0.6 % SOLN Place 2 drops (2 puffs total) into the nose 2 (two) times daily. 01/01/18 01/31/18  Valentina Shaggy, MD  pseudoephedrine (SUDAFED) 60 MG tablet Take 1 tablet (60 mg total) by mouth every 6 (six) hours as  needed for congestion. 11/19/17   Leonie Douglas, PA-C   Social History   Socioeconomic History  . Marital status: Single    Spouse name: Not on file  . Number of children: Not on file  . Years of education: Not on file  . Highest education level: Not on file  Occupational History  . Not on file  Social Needs  . Financial resource strain: Not on file  . Food insecurity:    Worry: Not on file    Inability: Not on file  . Transportation needs:    Medical: Not on file    Non-medical: Not on file  Tobacco Use  . Smoking status: Never Smoker  . Smokeless tobacco: Never Used  Substance and Sexual Activity  . Alcohol use: Yes    Alcohol/week: 1.8 - 2.4 oz    Types: 3 - 4 Standard drinks or equivalent per week  . Drug use: No  . Sexual activity: Yes    Partners: Female    Birth control/protection: Condom  Lifestyle  . Physical activity:    Days per week: Not on file    Minutes per session: Not on file  . Stress: Not on file  Relationships  . Social connections:    Talks on phone: Not on file    Gets together: Not on file    Attends religious service: Not on file    Active member of club or organization: Not on file  Attends meetings of clubs or organizations: Not on file    Relationship status: Not on file  . Intimate partner violence:    Fear of current or ex partner: Not on file    Emotionally abused: Not on file    Physically abused: Not on file    Forced sexual activity: Not on file  Other Topics Concern  . Not on file  Social History Narrative   ** Merged History Encounter **       Review of Systems  Skin: Positive for color change (+ R great toenail).      Objective:   Physical Exam  Constitutional: He is oriented to person, place, and time. He appears well-developed and well-nourished. No distress.  HENT:  Head: Normocephalic and atraumatic.  Eyes: Conjunctivae and EOM are normal.  Neck: Neck supple. No tracheal deviation present.  Cardiovascular:  Normal rate.  Pulmonary/Chest: Effort normal. No respiratory distress.  Musculoskeletal: Normal range of motion.  Neurological: He is alert and oriented to person, place, and time.  Skin: Skin is warm and dry.  Fingernails appear normal. L toenails appear normal. R great toenail: approximately lateral 1 quarter of toenail there is dark brown/black discoloration in linear fashion, extends the entire nail. No surrounding skin erythema. Nontender. No lifting of the nail.  Psychiatric: He has a normal mood and affect. His behavior is normal.  Nursing note and vitals reviewed.  Vitals:   03/06/18 1049  BP: 102/78  Pulse: (!) 33  Resp: 16  Temp: 98.3 F (36.8 C)  TempSrc: Oral  SpO2: 99%  Weight: 146 lb (66.2 kg)  Height: 5' 8.5" (1.74 m)      Assessment & Plan:  Antonio Decker is a 22 y.o. male Nail abnormality - Plan: Ambulatory referral to Dermatology  -Linear darkening of great toenail, present for years with recent increase darkening is no known injury.   -will refer to dermatology for evaluation, possible biopsy as differential includes acral lentiginous melanoma.   No orders of the defined types were placed in this encounter.  Patient Instructions   I will refer you to a dermatologist to evaluate the darkening of your toenail and decide if other treatment needed.  You should hear from their office within the next 2 to 3 weeks.  Let me know if there are questions in the meantime.  Thank you for coming in today.  IF you received an x-ray today, you will receive an invoice from [facility]. Please contact [facility] Radiology at [phone] with questions or concerns regarding your invoice.   IF you received labwork today, you will receive an invoice from [place]. Please contact [facility] at [phone] with questions or concerns regarding your invoice.   Our billing staff will not be able to assist you with questions regarding bills from these companies.  You will be  contacted with the lab results as soon as they are available. The fastest way to get your results is to activate your My Chart account. Instructions are located on the last page of this paperwork. If you have not heard from [place] regarding the results in 2 weeks, please contact this office.       I personally performed the services described in this documentation, which was scribed in my presence. The recorded information has been reviewed and considered for accuracy and completeness, addended by me as needed, and agree with information above.  Signed,   Merri Ray, MD Primary Care at [place].  03/06/18 11:43  AM

## 2018-03-06 NOTE — Patient Instructions (Addendum)
I will refer you to a dermatologist to evaluate the darkening of your toenail and decide if other treatment needed.  You should hear from their office within the next 2 to 3 weeks.  Let me know if there are questions in the meantime.  Thank you for coming in today.  IF you received an x-ray today, you will receive an invoice from [facility]. Please contact [facility] Radiology at [phone] with questions or concerns regarding your invoice.   IF you received labwork today, you will receive an invoice from [place]. Please contact [facility] at [phone] with questions or concerns regarding your invoice.   Our billing staff will not be able to assist you with questions regarding bills from these companies.  You will be contacted with the lab results as soon as they are available. The fastest way to get your results is to activate your My Chart account. Instructions are located on the last page of this paperwork. If you have not heard from [place] regarding the results in 2 weeks, please contact this office.

## 2018-05-12 NOTE — Progress Notes (Signed)
Subjective:    Patient ID: Antonio Decker, male    DOB: 02-05-1996, 22 y.o.   MRN: [id number]  05/18/2018  fingernail (follow up on finger nails and also hernia on stomache )    HPI This 22 y.o. male presents for evaluation of umbilical hernia, fingernail and toenail discoloration, allergic rhinitis.  Fingernail linear growth: s/p dermatology consultation; must schedule with another dermatologist for biopsy.  Due for biopsy; must schedule.   Right first toe with linear darkening; present for two years; spreading.  Patient very concerned about discoloration especially in toenail.  Umbilical hernia: had it and became painful; sent to surgeon last year; small; did not recommend surgery; now starting to become painful again; if enlarging, would recommend surgery; cannot tell if enlarging.   Works as Building control surveyor; works out four days per week; has been training since November 2018; from December to February.  May hurt twice in a month; does not hurt every month; possibly once per month; hurts for one day.  Not as severe or frequent pain as last summer.  Saw surgeon in December 2018.   Denies nausea, vomiting.  Denies bloody stools or black stools.  Allergic rhinitis: S/p allergy testing; s/p allergy consultation; grasses; nothing severe.  Nothing worked.  Chronic nasal congestion; always one nostril; changes.  Changes from day to day.  Allegra, Zyrtec, Flonase, Fluticasone.  Year round.    BP Readings from Last 3 Encounters:  05/18/18 104/76  03/06/18 102/78  01/01/18 108/64   Wt Readings from Last 3 Encounters:  05/18/18 148 lb (67.1 kg)  03/06/18 146 lb (66.2 kg)  01/01/18 149 lb 9.6 oz (67.9 kg)   Immunization History  Administered Date(s) Administered  . Influenza-Unspecified 11/22/2016  . Tdap 11/18/2010, 12/27/2016    Review of Systems  Constitutional: Negative for activity change, appetite change, chills, diaphoresis, fatigue and fever.  HENT: Positive for congestion,  postnasal drip, rhinorrhea and sneezing. Negative for dental problem, drooling, ear discharge, ear pain, facial swelling, hearing loss, mouth sores, nosebleeds, sinus pressure, sinus pain, sore throat, tinnitus, trouble swallowing and voice change.   Respiratory: Negative for cough and shortness of breath.   Cardiovascular: Negative for chest pain, palpitations and leg swelling.  Gastrointestinal: Positive for abdominal distention. Negative for abdominal pain, anal bleeding, blood in stool, constipation, diarrhea, nausea, rectal pain and vomiting.  Endocrine: Negative for cold intolerance, heat intolerance, polydipsia, polyphagia and polyuria.  Skin: Positive for color change. Negative for pallor, rash and wound.  Neurological: Negative for dizziness, tremors, seizures, syncope, facial asymmetry, speech difficulty, weakness, light-headedness, numbness and headaches.  Psychiatric/Behavioral: Negative for dysphoric mood and sleep disturbance. The patient is not nervous/anxious.     Past Medical History:  Diagnosis Date  . Seasonal allergies    No past surgical history on file. No Known Allergies Current Outpatient Medications on File Prior to Visit  Medication Sig Dispense Refill  . cetirizine (ZYRTEC) 10 MG tablet Take 1 tablet (10 mg total) by mouth daily. 30 tablet 11  . fexofenadine (ALLEGRA ALLERGY) 180 MG tablet Take 1 tablet (180 mg total) by mouth daily. 30 tablet 5   No current facility-administered medications on file prior to visit.    Social History   Socioeconomic History  . Marital status: Single    Spouse name: Not on file  . Number of children: Not on file  . Years of education: Not on file  . Highest education level: Not on file  Occupational History  . Not on file  Social Needs  . Financial resource strain: Not on file  . Food insecurity:    Worry: Not on file    Inability: Not on file  . Transportation needs:    Medical: Not on file    Non-medical: Not on file   Tobacco Use  . Smoking status: Never Smoker  . Smokeless tobacco: Never Used  Substance and Sexual Activity  . Alcohol use: Yes    Alcohol/week: 1.8 - 2.4 oz    Types: 3 - 4 Standard drinks or equivalent per week  . Drug use: No  . Sexual activity: Yes    Partners: Female    Birth control/protection: Condom  Lifestyle  . Physical activity:    Days per week: Not on file    Minutes per session: Not on file  . Stress: Not on file  Relationships  . Social connections:    Talks on phone: Not on file    Gets together: Not on file    Attends religious service: Not on file    Active member of club or organization: Not on file    Attends meetings of clubs or organizations: Not on file    Relationship status: Not on file  . Intimate partner violence:    Fear of current or ex partner: Not on file    Emotionally abused: Not on file    Physically abused: Not on file    Forced sexual activity: Not on file  Other Topics Concern  . Not on file  Social History Narrative   ** Merged History Encounter **       Family History  Problem Relation Age of Onset  . Diabetes Maternal Grandmother   . Migraines Maternal Grandmother   . Migraines Father   . Asthma Sister   . Asthma Brother   . Cataracts Paternal Grandmother        Objective:    BP 104/76   Pulse 77   Temp 98.9 F (37.2 C) (Oral)   Resp 18   Ht 5' 8.5" (1.74 m)   Wt 148 lb (67.1 kg)   SpO2 98%   BMI 22.18 kg/m  Physical Exam  Constitutional: He is oriented to person, place, and time. He appears well-developed and well-nourished. No distress.  HENT:  Head: Normocephalic and atraumatic.  Right Ear: External ear normal.  Left Ear: External ear normal.  Nose: Nose normal.  Mouth/Throat: Oropharynx is clear and moist.  Eyes: Pupils are equal, round, and reactive to light. Conjunctivae and EOM are normal.  Neck: Normal range of motion. Neck supple. Carotid bruit is not present. No thyromegaly present.    Cardiovascular: Normal rate, regular rhythm, normal heart sounds and intact distal pulses. Exam reveals no gallop and no friction rub.  No murmur heard. Pulmonary/Chest: Effort normal and breath sounds normal. He has no wheezes. He has no rales.  Abdominal: Soft. Bowel sounds are normal. He exhibits no distension and no mass. There is no hepatosplenomegaly. There is no tenderness. There is no rebound and no guarding. No hernia. Hernia confirmed negative in the ventral area.    Possible defect in abdominal wall superior to umbilicus.  No mass palpable.  Nontender to palpation.  Feet:  Right Foot:  Skin Integrity: Negative for ulcer, blister, skin breakdown, erythema, warmth, callus or dry skin.  Left Foot:  Skin Integrity: Negative for ulcer, blister, skin breakdown, erythema, warmth, callus or dry skin.  Lymphadenopathy:    He has no cervical adenopathy.  Neurological: He is  alert and oriented to person, place, and time. No cranial nerve deficit.  Skin: Skin is warm and dry. No rash noted. He is not diaphoretic.  Single fingernail with light brown linear discoloration and vertical distribution.  Single toenail with black discoloration of the first toe medially 3 mm in width.  Psychiatric: He has a normal mood and affect. His behavior is normal.  Nursing note and vitals reviewed.  No results found. Depression screen [facility] 2/9 05/18/2018 03/06/2018 11/19/2017 09/01/2017 12/27/2016  Decreased Interest 0 0 0 0 0  Down, Depressed, Hopeless 0 0 0 0 0  PHQ - 2 Score 0 0 0 0 0   Fall Risk  05/18/2018 03/06/2018 11/19/2017 09/01/2017  Falls in the past year? No No No No        Assessment & Plan:   1. Nail abnormality   2. Hernia of abdominal wall   3. Seasonal and perennial allergic rhinitis     No abnormalities/discoloration: Status post dermatology consultation.  Read the patient warrants biopsy especially toenail with black discoloration.  Duration of 2 years.  Not consistent with trauma.  Must  rule out malignancy.  Hernia of abdominal wall/umbilical hernia: Persistent yet less symptomatic than 1 year ago.  Status post general surgery consultation who recommended returning with increased symptoms.  Patient has participated in aggressive exercise regimen with improved core strengthening.  I do feel that improve core strengthening has decreased symptoms of umbilical hernia.  I do not recommend general surgery consultation at this time.  Recommend patient return to clinic for increased frequency and duration of hernia symptoms.  Presented to the emergency room for severe pain with vomiting.  Allergic rhinitis: Uncontrolled.  Status post allergy consult patient with allergy testing.  Minimal improvement with oral antihistamines and nasal steroids.  Patient declines trial of nasal antihistamine such as Astelin or Atrovent nasal spray.  No orders of the defined types were placed in this encounter.  No orders of the defined types were placed in this encounter.   No follow-ups on file.   Antonio Decker, M.D. Primary Care at [facility] previously [facility] [address] [place], [place]  [address] [phone] phone [phone] fax

## 2018-05-18 ENCOUNTER — Encounter: Payer: Self-pay | Admitting: Family Medicine

## 2018-05-18 ENCOUNTER — Other Ambulatory Visit: Payer: Self-pay

## 2018-05-18 ENCOUNTER — Ambulatory Visit (INDEPENDENT_AMBULATORY_CARE_PROVIDER_SITE_OTHER): Payer: BC Managed Care – PPO | Admitting: Family Medicine

## 2018-05-18 VITALS — BP 104/76 | HR 77 | Temp 98.9°F | Resp 18 | Ht 68.5 in | Wt 148.0 lb

## 2018-05-18 DIAGNOSIS — K439 Ventral hernia without obstruction or gangrene: Secondary | ICD-10-CM | POA: Diagnosis not present

## 2018-05-18 DIAGNOSIS — L609 Nail disorder, unspecified: Secondary | ICD-10-CM | POA: Diagnosis not present

## 2018-05-18 DIAGNOSIS — J302 Other seasonal allergic rhinitis: Secondary | ICD-10-CM

## 2018-05-18 DIAGNOSIS — J3089 Other allergic rhinitis: Secondary | ICD-10-CM | POA: Diagnosis not present

## 2018-05-18 NOTE — Patient Instructions (Addendum)
IF you received an x-ray today, you will receive an invoice from [facility] Radiology. Please contact [facility] Radiology at [phone] with questions or concerns regarding your invoice.   IF you received labwork today, you will receive an invoice from [place]. Please contact [facility] at [phone] with questions or concerns regarding your invoice.   Our billing staff will not be able to assist you with questions regarding bills from these companies.  You will be contacted with the lab results as soon as they are available. The fastest way to get your results is to activate your My Chart account. Instructions are located on the last page of this paperwork. If you have not heard from [place] regarding the results in 2 weeks, please contact this office.     Umbilical Hernia, Adult A hernia is a bulge of tissue that pushes through an opening between muscles. An umbilical hernia happens in the abdomen, near the belly button (umbilicus). The hernia may contain tissues from the small intestine, large intestine, or fatty tissue covering the intestines (omentum). Umbilical hernias in adults tend to get worse over time, and they require surgical treatment. There are several types of umbilical hernias. You may have:  A hernia located just above or below the umbilicus (indirect hernia). This is the most common type of umbilical hernia in adults.  A hernia that forms through an opening formed by the umbilicus (direct hernia).  A hernia that comes and goes (reducible hernia). A reducible hernia may be visible only when you strain, lift something heavy, or cough. This type of hernia can be pushed back into the abdomen (reduced).  A hernia that traps abdominal tissue inside the hernia (incarcerated hernia). This type of hernia cannot be reduced.  A hernia that cuts off blood flow to the tissues inside the hernia (strangulated hernia). The tissues can start to die if this happens. This type of hernia  requires emergency treatment.  What are the causes? An umbilical hernia happens when tissue inside the abdomen presses on a weak area of the abdominal muscles. What increases the risk? You may have a greater risk of this condition if you:  Are obese.  Have had several pregnancies.  Have a buildup of fluid inside your abdomen (ascites).  Have had surgery that weakens the abdominal muscles.  What are the signs or symptoms? The main symptom of this condition is a painless bulge at or near the belly button. A reducible hernia may be visible only when you strain, lift something heavy, or cough. Other symptoms may include:  Dull pain.  A feeling of pressure.  Symptoms of a strangulated hernia may include:  Pain that gets increasingly worse.  Nausea and vomiting.  Pain when pressing on the hernia.  Skin over the hernia becoming red or purple.  Constipation.  Blood in the stool.  How is this diagnosed? This condition may be diagnosed based on:  A physical exam. You may be asked to cough or strain while standing. These actions increase the pressure inside your abdomen and force the hernia through the opening in your muscles. Your health care provider may try to reduce the hernia by pressing on it.  Your symptoms and medical history.  How is this treated? Surgery is the only treatment for an umbilical hernia. Surgery for a strangulated hernia is done as soon as possible. If you have a small hernia that is not incarcerated, you may need to lose weight before having surgery. Follow these instructions at  home:  Lose weight, if told by your health care provider.  Do not try to push the hernia back in.  Watch your hernia for any changes in color or size. Tell your health care provider if any changes occur.  You may need to avoid activities that increase pressure on your hernia.  Do not lift anything that is heavier than 10 lb (4.5 kg) until your health care provider says that  this is safe.  Take over-the-counter and prescription medicines only as told by your health care provider.  Keep all follow-up visits as told by your health care provider. This is important. Contact a health care provider if:  Your hernia gets larger.  Your hernia becomes painful. Get help right away if:  You develop sudden, severe pain near the area of your hernia.  You have pain as well as nausea or vomiting.  You have pain and the skin over your hernia changes color.  You develop a fever. This information is not intended to replace advice given to you by your health care provider. Make sure you discuss any questions you have with your health care provider. Document Released: 04/05/2016 Document Revised: 07/07/2016 Document Reviewed: 04/05/2016 Elsevier Interactive Patient Education  [employer].

## 2018-06-12 ENCOUNTER — Other Ambulatory Visit: Payer: Self-pay

## 2018-06-12 ENCOUNTER — Ambulatory Visit (INDEPENDENT_AMBULATORY_CARE_PROVIDER_SITE_OTHER): Payer: BC Managed Care – PPO | Admitting: Urgent Care

## 2018-06-12 ENCOUNTER — Encounter: Payer: Self-pay | Admitting: Urgent Care

## 2018-06-12 VITALS — BP 124/75 | HR 66 | Temp 98.2°F | Resp 16 | Ht 68.5 in | Wt 151.6 lb

## 2018-06-12 DIAGNOSIS — Z113 Encounter for screening for infections with a predominantly sexual mode of transmission: Secondary | ICD-10-CM | POA: Diagnosis not present

## 2018-06-12 DIAGNOSIS — R4582 Worries: Secondary | ICD-10-CM | POA: Diagnosis not present

## 2018-06-12 NOTE — Patient Instructions (Addendum)

## 2018-06-12 NOTE — Progress Notes (Signed)
    MRN: [id number] DOB: 03/28/1996  Subjective:   Antonio Decker is a 22 y.o. male presenting for routine STI check.  Patient has one male partner.  He does not use condoms for protection.  Denies fever, dysuria, hematuria, urinary frequency, penile discharge, penile swelling, testicular pain, testicular swelling, anal pain, groin pain.   Antonio Decker is not currently taking any medications.  Also has No Known Allergies.  Antonio Decker  has a past medical history of Seasonal allergies. Denies past surgical history.   Objective:   Vitals: BP 124/75   Pulse 66   Temp 98.2 F (36.8 C)   Resp 16   Ht 5' 8.5" (1.74 m)   Wt 151 lb 9.6 oz (68.8 kg)   SpO2 99%   BMI 22.72 kg/m   Physical Exam  Constitutional: He is oriented to person, place, and time. He appears well-developed and well-nourished.  Cardiovascular: Normal rate.  Pulmonary/Chest: Effort normal.  Neurological: He is alert and oriented to person, place, and time.  Psychiatric: He has a normal mood and affect.   Assessment and Plan :   Worries - Plan: GC/Chlamydia Probe Amp([facility]), Trichomonas vaginalis, RNA, RPR, HIV antibody  Routine screening for STI (sexually transmitted infection)  Counseled patient on STI testing.  Encouraged him to continue safe sex practices.  Will notify patient of lab results either by phone call or my chart.  Jaynee Eagles, PA-C Primary Care at [facility] [phone] 06/12/2018  4:40 PM

## 2018-06-13 LAB — RPR: RPR: NONREACTIVE

## 2018-06-13 LAB — HIV ANTIBODY (ROUTINE TESTING W REFLEX): HIV SCREEN 4TH GENERATION: NONREACTIVE

## 2018-06-14 LAB — TRICHOMONAS VAGINALIS, PROBE AMP: Trich vag by NAA: NEGATIVE

## 2018-06-14 LAB — GC/CHLAMYDIA PROBE AMP
Chlamydia trachomatis, NAA: NEGATIVE
Neisseria gonorrhoeae by PCR: NEGATIVE

## 2018-06-17 NOTE — Progress Notes (Signed)
Antonio Decker  MRN: [id number] DOB: 1996/07/13  Subjective:  Antonio Decker is a 22 y.o. male seen in office today for a chief complaint of urethral/ penile tingling x 3 days. Feels like he has the urge to pee. Reminds him of when he had chlamydia. Last evaluated by PA Mani in office on 06/12/18 for routine STD check. Labs negative for HIV, syphilis, trichomonas, gonorrhea and chlamydia. One new exposures since last visit. Notes he got drunk and had sexual intercourse with male coworker. No known exposure.  16 total lifetime sexual partners. Does not use condoms. Denies fever, dysuria, hematuria, urinary frequency, genital lesion, penile discharge, penile swelling, testicular pain, testicular swelling, anal pain, and groin pain.  ,  Review of Systems  Constitutional: Negative for chills, diaphoresis and fever.  HENT: Negative for sore throat.   Gastrointestinal: Negative for abdominal pain, nausea and vomiting.  Musculoskeletal: Negative for arthralgias.    Patient Active Problem List   Diagnosis Date Noted  . Mild intermittent asthma, uncomplicated 24/07/7352  . Seasonal and perennial allergic rhinitis 01/01/2018  . Screening for STD (sexually transmitted disease) 12/27/2016  . Allergic rhinitis due to allergen 12/27/2016    Current Outpatient Medications on File Prior to Visit  Medication Sig Dispense Refill  . cetirizine (ZYRTEC) 10 MG tablet Take 1 tablet (10 mg total) by mouth daily. (Patient not taking: Reported on 06/12/2018) 30 tablet 11  . fexofenadine (ALLEGRA ALLERGY) 180 MG tablet Take 1 tablet (180 mg total) by mouth daily. 30 tablet 5   No current facility-administered medications on file prior to visit.     No Known Allergies    Social History   Socioeconomic History  . Marital status: Single    Spouse name: Not on file  . Number of children: 0  . Years of education: Not on file  . Highest education level: Not on file  Occupational History  . Not on file  Social  Needs  . Financial resource strain: Not on file  . Food insecurity:    Worry: Not on file    Inability: Not on file  . Transportation needs:    Medical: Not on file    Non-medical: Not on file  Tobacco Use  . Smoking status: Never Smoker  . Smokeless tobacco: Never Used  Substance and Sexual Activity  . Alcohol use: Yes    Alcohol/week: 1.8 - 2.4 oz    Types: 3 - 4 Standard drinks or equivalent per week  . Drug use: No  . Sexual activity: Yes    Partners: Female    Birth control/protection: Condom  Lifestyle  . Physical activity:    Days per week: Not on file    Minutes per session: Not on file  . Stress: Not on file  Relationships  . Social connections:    Talks on phone: Not on file    Gets together: Not on file    Attends religious service: Not on file    Active member of club or organization: Not on file    Attends meetings of clubs or organizations: Not on file    Relationship status: Not on file  . Intimate partner violence:    Fear of current or ex partner: Not on file    Emotionally abused: Not on file    Physically abused: Not on file    Forced sexual activity: Not on file  Other Topics Concern  . Not on file  Social History Narrative   ** Merged History  Encounter **        Objective:  BP 120/82 (BP Location: Left Arm, Patient Position: Sitting, Cuff Size: Normal)   Pulse 81   Temp 98.7 F (37.1 C) (Oral)   Resp 20   Ht 5' 8.5" (1.74 m)   Wt 153 lb (69.4 kg)   SpO2 99%   BMI 22.92 kg/m   Physical Exam  Constitutional: He is oriented to person, place, and time. He appears well-developed and well-nourished. No distress.  HENT:  Head: Normocephalic and atraumatic.  Eyes: Conjunctivae are normal.  Neck: Normal range of motion.  Pulmonary/Chest: Effort normal.  Genitourinary: Testes normal and penis normal. Circumcised. No phimosis, paraphimosis, hypospadias, penile erythema or penile tenderness. No discharge found.  Genitourinary Comments: CMA  chaperone present for GU exam.   Neurological: He is alert and oriented to person, place, and time.  Skin: Skin is warm and dry.  Psychiatric: He has a normal mood and affect.  Vitals reviewed.  Results for orders placed or performed in visit on 06/18/18 (from the past 24 hour(s))  POCT urinalysis dipstick     Status: None   Collection Time: 06/18/18  5:03 PM  Result Value Ref Range   Color, UA yellow yellow   Clarity, UA clear clear   Glucose, UA negative negative mg/dL   Bilirubin, UA negative negative   Ketones, POC UA negative negative mg/dL   Spec Grav, UA 1.025 1.010 - 1.025   Blood, UA negative negative   pH, UA 6.5 5.0 - 8.0   Protein Ur, POC negative negative mg/dL   Urobilinogen, UA 0.2 0.2 or 1.0 E.U./dL   Nitrite, UA Negative Negative   Leukocytes, UA Negative Negative    Assessment and Plan :  1. Penile pain 2. Urethritis 3. High risk heterosexual behavior Due to hx and sx, will treat empirically for gonorrhea and chlamydia at this time. Normal GU exam in office. UA normal.  Labs pending. Educated pt that if he develops any genital lesions, return for culture of HSV. Educated on safe sex practices. Refrain from sexual intercourse for 7 days after tx and until lab results return. Follow up as needed.  - POCT urinalysis dipstick - GC/Chlamydia Probe Amp - Hepatitis panel, acute - HIV antibody - RPR - Trichomonas vaginalis, RNA - cefTRIAXone (ROCEPHIN) injection 250 mg - azithromycin (ZITHROMAX) 250 MG tablet; Take 4 tablets (1,000 mg total) by mouth once for 1 dose.  Dispense: 4 tablet; Refill: 0   Tenna Delaine PA-C  Primary Care at [facility] 06/18/2018 5:06 PM

## 2018-06-18 ENCOUNTER — Encounter: Payer: Self-pay | Admitting: Physician Assistant

## 2018-06-18 ENCOUNTER — Other Ambulatory Visit: Payer: Self-pay

## 2018-06-18 ENCOUNTER — Ambulatory Visit (INDEPENDENT_AMBULATORY_CARE_PROVIDER_SITE_OTHER): Payer: BC Managed Care – PPO | Admitting: Physician Assistant

## 2018-06-18 VITALS — BP 120/82 | HR 81 | Temp 98.7°F | Resp 20 | Ht 68.5 in | Wt 153.0 lb

## 2018-06-18 DIAGNOSIS — N342 Other urethritis: Secondary | ICD-10-CM | POA: Diagnosis not present

## 2018-06-18 DIAGNOSIS — N4889 Other specified disorders of penis: Secondary | ICD-10-CM

## 2018-06-18 DIAGNOSIS — Z7251 High risk heterosexual behavior: Secondary | ICD-10-CM

## 2018-06-18 LAB — POCT URINALYSIS DIP (MANUAL ENTRY)
BILIRUBIN UA: NEGATIVE
BILIRUBIN UA: NEGATIVE mg/dL
Blood, UA: NEGATIVE
Glucose, UA: NEGATIVE mg/dL
LEUKOCYTES UA: NEGATIVE
Nitrite, UA: NEGATIVE
PH UA: 6.5 (ref 5.0–8.0)
Protein Ur, POC: NEGATIVE mg/dL
Spec Grav, UA: 1.025 (ref 1.010–1.025)
Urobilinogen, UA: 0.2 E.U./dL

## 2018-06-18 MED ORDER — AZITHROMYCIN 250 MG PO TABS: 1000.0000 mg | ORAL_TABLET | Freq: Once | ORAL | 0 refills | Status: AC

## 2018-06-18 MED ORDER — CEFTRIAXONE SODIUM 250 MG IJ SOLR
250.0000 mg | Freq: Once | INTRAMUSCULAR | Status: AC
  Administered 2018-06-18: 250 mg via INTRAMUSCULAR

## 2018-06-18 NOTE — Patient Instructions (Addendum)
We have treated you empirically for gonorrhea and chlamydia. Your labs are pending.I recommend refraining from sexual intercourse for at least 7 days after you complete treatment and definitely until your lab work returns. Tingling can be an indicator of HSV infection, we did not test for that today as it would not show up in your blood yet. If you do break out in blisters on your penis, please return immediately. I also recommend using condoms in the future. Below is info regarding safe sex practices. Thank you for letting me participate in your health and well being.      Safe Sex Practicing safe sex means taking steps before and during sex to reduce your risk of:  Getting an STD (sexually transmitted disease).  Giving your partner an STD.  Unwanted pregnancy.  How can I practice safe sex?  To practice safe sex:  Limit your sexual partners to only one partner who is having sex with only you.  Avoid using alcohol and recreational drugs before having sex. These substances can affect your judgment.  Before having sex with a new partner: ? Talk to your partner about past partners, past STDs, and drug use. ? You and your partner should be screened for STDs and discuss the results with each other.  Check your body regularly for sores, blisters, rashes, or unusual discharge. If you notice any of these problems, visit your health care provider.  If you have symptoms of an infection or you are being treated for an STD, avoid sexual contact.  While having sex, use a condom. Make sure to: ? Use a condom every time you have vaginal, oral, or anal sex. Both females and males should wear condoms during oral sex. ? Keep condoms in place from the beginning to the end of sexual activity. ? Use a latex condom, if possible. Latex condoms offer the best protection. ? Use only water-based lubricants or oils to lubricate a condom. Using petroleum-based lubricants or oils will weaken the condom and  increase the chance that it will break.  See your health care provider for regular screenings, exams, and tests for STDs.  Talk with your health care provider about the form of birth control (contraception) that is best for you.  Get vaccinated against hepatitis B and human papillomavirus (HPV).  If you are at risk of being infected with HIV (human immunodeficiency virus), talk with your health care provider about taking a prescription medicine to prevent HIV infection. You are considered at risk for HIV if: ? You are a man who has sex with other men. ? You are a heterosexual man or woman who is sexually active with more than one partner. ? You take drugs by injection. ? You are sexually active with a partner who has HIV.  This information is not intended to replace advice given to you by your health care provider. Make sure you discuss any questions you have with your health care provider. Document Released: 12/12/2004 Document Revised: 03/20/2016 Document Reviewed: 09/24/2015 Elsevier Interactive Patient Education  2018 [employer].  IF you received an x-ray today, you will receive an invoice from [facility]. Please contact [facility] at [phone] with questions or concerns regarding your invoice.   IF you received labwork today, you will receive an invoice from [facility]. Please contact [facility] at [phone] with questions or concerns regarding your invoice.   Our billing staff will not be able to assist you with questions regarding bills from these companies.  You will be  contacted with the lab results as soon as they are available. The fastest way to get your results is to activate your My Chart account. Instructions are located on the last page of this paperwork. If you have not heard from [place] regarding the results in 2 weeks, please contact this office.

## 2018-06-19 LAB — HEPATITIS PANEL, ACUTE
HEP A IGM: NEGATIVE
HEP B C IGM: NEGATIVE
Hepatitis B Surface Ag: NEGATIVE

## 2018-06-19 LAB — HIV ANTIBODY (ROUTINE TESTING W REFLEX): HIV SCREEN 4TH GENERATION: NONREACTIVE

## 2018-06-19 LAB — RPR: RPR Ser Ql: NONREACTIVE

## 2018-06-20 ENCOUNTER — Encounter: Payer: Self-pay | Admitting: Physician Assistant

## 2018-06-20 LAB — GC/CHLAMYDIA PROBE AMP
CHLAMYDIA, DNA PROBE: NEGATIVE
NEISSERIA GONORRHOEAE BY PCR: NEGATIVE

## 2018-06-20 LAB — TRICHOMONAS VAGINALIS, PROBE AMP: TRICH VAG BY NAA: NEGATIVE

## 2018-06-23 ENCOUNTER — Ambulatory Visit (INDEPENDENT_AMBULATORY_CARE_PROVIDER_SITE_OTHER): Payer: BC Managed Care – PPO | Admitting: Podiatry

## 2018-06-23 ENCOUNTER — Encounter: Payer: Self-pay | Admitting: Podiatry

## 2018-06-23 DIAGNOSIS — B351 Tinea unguium: Secondary | ICD-10-CM | POA: Diagnosis not present

## 2018-06-23 DIAGNOSIS — L608 Other nail disorders: Secondary | ICD-10-CM | POA: Diagnosis not present

## 2018-06-23 NOTE — Progress Notes (Signed)
   Subjective:    Patient ID: Antonio Decker, male    DOB: 12-07-1995, 22 y.o.   MRN: [id number]  HPI 22 year old male presents the office today for concerns of a dark pigmented streak in his right big toenail which is been ongoing for last several months.  He did go to a dermatologist and was referred here for evaluation.  He said no treatment.  Denies any pain.  Denies any redness or drainage or any swelling.  He also states he started to get streaks in his fingernails as well but not as dark as the right toenail.  He has no other concerns.   Review of Systems  All other systems reviewed and are negative.  Past Medical History:  Diagnosis Date  . Seasonal allergies     History reviewed. No pertinent surgical history.  No current outpatient medications on file.  No Known Allergies       Objective:   Physical Exam  General: AAO x3, NAD  Dermatological: Thick hyperpigmented streak is present in the lateral aspect the right hallux toenail.  Upon debridement there is no extension of any hyperpigmentation to the surrounding skin or in the nail bed.  There is no pain to the nail there is no edema, erythema, drainage or pus or any signs of infection.  No open lesions.  Vascular: Dorsalis Pedis artery and Posterior Tibial artery pedal pulses are 2/4 bilateral with immedate capillary fill time. There is no pain with calf compression, swelling, warmth, erythema.   Neruologic: Grossly intact via light touch bilateral.  Protective threshold with Semmes Wienstein monofilament intact to all pedal sites bilateral.   Musculoskeletal: No gross boney pedal deformities bilateral. No pain, crepitus, or limitation noted with foot and ankle range of motion bilateral. Muscular strength 5/5 in all groups tested bilateral.  Gait: Unassisted, Nonantalgic.     Assessment & Plan:  Hyperpigmented shoe to right hallux toenail -Treatment options discussed including all alternatives, risks, and  complications -Etiology of symptoms were discussed -This is likely benign lesion however I did debride the nail and I did debride the loose portion to remove good portion of the hyperpigmented area and some this for pathology to [facility].  Discussed possible partial or total nail avulsion but with the results of this biopsy before proceeding.  Trula Slade DPM

## 2018-06-24 NOTE — Addendum Note (Signed)
Addended by: Cranford Mon R on: 06/24/2018 10:06 AM   Modules accepted: Orders

## 2018-07-08 ENCOUNTER — Telehealth: Payer: Self-pay | Admitting: Podiatry

## 2018-07-08 NOTE — Telephone Encounter (Signed)
Left vm for pt to call to schedule follow up with Dr Jacqualyn Posey.

## 2018-07-09 ENCOUNTER — Ambulatory Visit: Payer: Managed Care, Other (non HMO) | Admitting: Podiatry

## 2018-07-24 ENCOUNTER — Encounter: Payer: Self-pay | Admitting: Podiatry

## 2018-07-24 ENCOUNTER — Ambulatory Visit (INDEPENDENT_AMBULATORY_CARE_PROVIDER_SITE_OTHER): Payer: BC Managed Care – PPO | Admitting: Podiatry

## 2018-07-24 DIAGNOSIS — B351 Tinea unguium: Secondary | ICD-10-CM | POA: Diagnosis not present

## 2018-07-24 DIAGNOSIS — L608 Other nail disorders: Secondary | ICD-10-CM | POA: Diagnosis not present

## 2018-07-25 NOTE — Progress Notes (Signed)
Subjective: 22 year old male presents the office today for evaluation of nail culture results.  She is also tender about the same.  He continues to have a dark streak in his right big toenail he also gets in his fingernails 2.  No other concerns today. Denies any systemic complaints such as fevers, chills, nausea, vomiting. No acute changes since last appointment, and no other complaints at this time.   Objective: AAO x3, NAD DP/PT pulses palpable bilaterally, CRT less than 3 seconds Longitudinal streak present in the right hallux toenail which is unchanged and there is no extension of any hyperpigmentation of the external skin.  Overall the second digit toenails are mildly hypertrophic, dystrophic ill-defined discoloration.  There is no pain in the nails there is no cellulitis or drainage or palpable signs of infection noted today. No open lesions or pre-ulcerative lesions.  No pain with calf compression, swelling, warmth, erythema  Assessment: Longitudinal melanonychia right hallux with possible onychomycosis to the other nails  Plan: -All treatment options discussed with the patient including all alternatives, risks, complications.  -I discussed the nail culture results with him.  Although there was not improvement there is no evidence of melanocytic neoplasia.  This is likely benign lesion but we discussed that there is been worsening to let me know we discussed signs of what to look for.  Discussed possible removal of the toenail but he wishes to hold off.  Regards to the other toenails we did discuss options for possible nail fungus.  I discussed options and he is likely to proceed with topical treatment.  I prescribed an ointment today through [employer].  -Patient encouraged to call the office with any questions, concerns, change in symptoms.   Trula Slade DPM

## 2018-08-25 ENCOUNTER — Telehealth: Payer: Self-pay | Admitting: *Deleted

## 2018-08-26 ENCOUNTER — Ambulatory Visit: Payer: Managed Care, Other (non HMO) | Admitting: Family Medicine

## 2018-09-01 NOTE — Telephone Encounter (Signed)
Called patient last Tuesday to see how the patient was doing and patient stated that he was doing fine as far as his toenails were concerned. Lattie Haw

## 2018-09-05 ENCOUNTER — Encounter: Payer: Self-pay | Admitting: Family Medicine

## 2018-09-05 ENCOUNTER — Other Ambulatory Visit: Payer: Self-pay

## 2018-09-05 ENCOUNTER — Ambulatory Visit (INDEPENDENT_AMBULATORY_CARE_PROVIDER_SITE_OTHER): Payer: BC Managed Care – PPO | Admitting: Family Medicine

## 2018-09-05 ENCOUNTER — Ambulatory Visit (INDEPENDENT_AMBULATORY_CARE_PROVIDER_SITE_OTHER): Payer: BC Managed Care – PPO

## 2018-09-05 VITALS — BP 111/74 | HR 59 | Temp 98.0°F | Resp 16 | Ht 68.11 in | Wt 146.4 lb

## 2018-09-05 DIAGNOSIS — Z23 Encounter for immunization: Secondary | ICD-10-CM | POA: Diagnosis not present

## 2018-09-05 DIAGNOSIS — M25512 Pain in left shoulder: Secondary | ICD-10-CM | POA: Diagnosis not present

## 2018-09-05 DIAGNOSIS — R29898 Other symptoms and signs involving the musculoskeletal system: Secondary | ICD-10-CM

## 2018-09-05 DIAGNOSIS — S4992XA Unspecified injury of left shoulder and upper arm, initial encounter: Secondary | ICD-10-CM

## 2018-09-05 DIAGNOSIS — Z711 Person with feared health complaint in whom no diagnosis is made: Secondary | ICD-10-CM | POA: Diagnosis not present

## 2018-09-05 MED ORDER — PREDNISONE 20 MG PO TABS: ORAL_TABLET | ORAL | 0 refills | Status: DC

## 2018-09-05 NOTE — Patient Instructions (Addendum)
     If you have lab work done today you will be contacted with your lab results within the next 2 weeks.  If you have not heard from us then please contact us. The fastest way to get your results is to register for My Chart.   IF you received an x-ray today, you will receive an invoice from [facility]. Please contact  Radiology at [phone] with questions or concerns regarding your invoice.   IF you received labwork today, you will receive an invoice from [facility]. Please contact [facility] at [phone] with questions or concerns regarding your invoice.   Our billing staff will not be able to assist you with questions regarding bills from these companies.  You will be contacted with the lab results as soon as they are available. The fastest way to get your results is to activate your My Chart account. Instructions are located on the last page of this paperwork. If you have not heard from us regarding the results in 2 weeks, please contact this office.     Shoulder Pain Many things can cause shoulder pain, including:  An injury.  Moving the arm in the same way again and again (overuse).  Joint pain (arthritis).  Follow these instructions at home: Take these actions to help with your pain:  Squeeze a soft ball or a foam pad as much as you can. This helps to prevent swelling. It also makes the arm stronger.  Take over-the-counter and prescription medicines only as told by your doctor.  If told, put ice on the area: ? Put ice in a plastic bag. ? Place a towel between your skin and the bag. ? Leave the ice on for 20 minutes, 2-3 times per day. Stop putting on ice if it does not help with the pain.  If you were given a shoulder sling or immobilizer: ? Wear it as told. ? Remove it to shower or bathe. ? Move your arm as little as possible. ? Keep your hand moving. This helps prevent swelling.  Contact a doctor if:  Your pain gets worse.  Medicine does  not help your pain.  You have new pain in your arm, hand, or fingers. Get help right away if:  Your arm, hand, or fingers: ? Tingle. ? Are numb. ? Are swollen. ? Are painful. ? Turn white or blue. This information is not intended to replace advice given to you by your health care provider. Make sure you discuss any questions you have with your health care provider. Document Released: 04/22/2008 Document Revised: 06/30/2016 Document Reviewed: 02/27/2015 Elsevier Interactive Patient Education  2018 [employer] Inc[employer]  

## 2018-09-05 NOTE — Progress Notes (Signed)
Patient ID: Antonio Decker, male    DOB: Apr 11, 1996, 22 y.o.   MRN: [id number]  PCP: Dorise Hiss, PA-C  Chief Complaint  Patient presents with  . Shoulder Pain    left shoulder x 1 month pt states he popped his shoulder out of place and now having pain    Subjective:  HPI Antonio Decker is a 22 y.o. male presents for evaluation left shoulder pain and concern for HPV virus.  Left shoulder Pain Patient reports that he sustained an injury to his left shoulder, wrestling with his cousin. He felt the sensation of his left shoulder popped x1 month ago.  He did not seek care after injury. Reports left shoulder was very painful subsequent days following the injury.  He reports that the pain has decreased although he continues to have residual pain and weakness in the left shoulder.  He typically works out at [employer] lifting now he is unable to engage in these activities. He has not attempted relief with any OTC medications or conservative measures.  HPV Partner had a recent PAP which was positive for HPV. He reports receiving Gardisil vaccinations, however remains concern for being infected with the virus. He is free of warts or lesions.  Engages in heterosexual sex only. Inconsistent use of barrier protection. Social History   Socioeconomic History  . Marital status: Single    Spouse name: Not on file  . Number of children: 0  . Years of education: Not on file  . Highest education level: Not on file  Occupational History  . Not on file  Social Needs  . Financial resource strain: Not on file  . Food insecurity:    Worry: Not on file    Inability: Not on file  . Transportation needs:    Medical: Not on file    Non-medical: Not on file  Tobacco Use  . Smoking status: Never Smoker  . Smokeless tobacco: Never Used  Substance and Sexual Activity  . Alcohol use: Yes    Alcohol/week: 3.0 - 4.0 standard drinks    Types: 3 - 4 Standard drinks or equivalent per week  . Drug  use: No  . Sexual activity: Yes    Partners: Female    Birth control/protection: Condom  Lifestyle  . Physical activity:    Days per week: Not on file    Minutes per session: Not on file  . Stress: Not on file  Relationships  . Social connections:    Talks on phone: Not on file    Gets together: Not on file    Attends religious service: Not on file    Active member of club or organization: Not on file    Attends meetings of clubs or organizations: Not on file    Relationship status: Not on file  . Intimate partner violence:    Fear of current or ex partner: Not on file    Emotionally abused: Not on file    Physically abused: Not on file    Forced sexual activity: Not on file  Other Topics Concern  . Not on file  Social History Narrative   ** Merged History Encounter **        Family History  Problem Relation Age of Onset  . Diabetes Maternal Grandmother   . Migraines Maternal Grandmother   . Migraines Father   . Asthma Sister   . Asthma Brother   . Cataracts Paternal Grandfather      Review of Systems  Patient Active Problem List   Diagnosis Date Noted  . Mild intermittent asthma, uncomplicated 78/46/9629  . Seasonal and perennial allergic rhinitis 01/01/2018  . Screening for STD (sexually transmitted disease) 12/27/2016  . Allergic rhinitis due to allergen 12/27/2016    No Known Allergies  Prior to Admission medications   Medication Sig Start Date End Date Taking? Authorizing Provider  NON FORMULARY [facility]  Onychomycosis Nail Lacquer -  Fluconazole 2%, Terbinafine 1% DMSO/undecylenic acid 25% Apply to affected nail once daily Qty. 120 gm 3 refills   Yes [provider]    Past Medical, Surgical Family and Social History reviewed and updated.    Objective:   Today's Vitals   09/05/18 0841  BP: 111/74  Pulse: (!) 59  Resp: 16  Temp: 98 F (36.7 C)  TempSrc: Oral  SpO2: 96%  Weight: 146 lb 6.4 oz (66.4 kg)  Height: 5'  8.11" (1.73 m)    Wt Readings from Last 3 Encounters:  09/05/18 146 lb 6.4 oz (66.4 kg)  06/18/18 153 lb (69.4 kg)  06/12/18 151 lb 9.6 oz (68.8 kg)     Physical Exam General appearance: alert, well developed, well nourished, cooperative and in no distress Head: Normocephalic, without obvious abnormality, atraumatic Respiratory: Respirations even and unlabored, normal respiratory rate Heart: rate and rhythm normal. No gallop or murmurs noted on exam  Extremities: No gross deformities. Left shoulder limited ROM internal and external rotation. Skin: Skin color, texture, turgor normal. No rashes seen  Psych: Appropriate mood and affect. Neurologic: Mental status: Alert, oriented to person, place, and time, thought content appropriate.   Assessment & Plan:  1. Need for prophylactic vaccination and inoculation against influenza - Flu Vaccine QUAD 36+ mos IM  2. Injury of left shoulder, initial encounter - DG Shoulder Left Dg Shoulder Left  Result Date: 09/05/2018 CLINICAL DATA:  Injury of left shoulder, initial encounter. EXAM: LEFT SHOULDER - 2+ VIEW COMPARISON:  None. FINDINGS: Left shoulder is located without fracture. Normal appearance of the left AC joint. Visualized left ribs are intact. IMPRESSION: Negative. Electronically Signed   By: Markus Daft M.D.   On: 09/05/2018 09:24  -Trial prednisone taper - Ambulatory referral to Sports Medicine  3. Shoulder weakness -trial prednisone taper and recommend follow-up with sports medicine   Ambulatory referral to Sports Medicine  4. Concern about STD in male without diagnosis Information provided regarding HPV Patient vaccinated in the past, asymptomatic Advise if genital wart or lesion develops, follow-up for additional testing    Meds ordered this encounter  Medications  . predniSONE (DELTASONE) 20 MG tablet    Sig: Take 3 PO QAM x3days, 2 PO QAM x3days, 1 PO QAM x3days    Dispense:  18 tablet    Refill:  0   Orders Placed  This Encounter  Procedures  . DG Shoulder Left  . Flu Vaccine QUAD 36+ mos IM  . Ambulatory referral to Sports Medicine     A total of 25 minutes spent, greater than 50 % of this time was spent counseling and coordination of care.   -The patient was given clear instructions to go to ER or return to medical center if symptoms do not improve, worsen or new problems develop. The patient verbalized understanding.     Carroll Sage. Kenton Kingfisher, FNP-C Nurse Practitioner (PRN Staff)  Primary Care at [facility] [address] [place], [place]

## 2018-09-17 NOTE — Progress Notes (Deleted)
  Antonio Decker - 22 y.o. male MRN [id number]  Date of birth: [birth year]  SUBJECTIVE:  Including CC & ROS.  No chief complaint on file.   Antonio Decker is a 22 y.o. male that is  ***.  ***   Review of Systems  HISTORY: Past Medical, Surgical, Social, and Family History Reviewed & Updated per EMR.   Pertinent Historical Findings include:  Past Medical History:  Diagnosis Date  . Seasonal allergies     No past surgical history on file.  No Known Allergies  Family History  Problem Relation Age of Onset  . Diabetes Maternal Grandmother   . Migraines Maternal Grandmother   . Migraines Father   . Asthma Sister   . Asthma Brother   . Cataracts Paternal Grandfather      Social History   Socioeconomic History  . Marital status: Single    Spouse name: Not on file  . Number of children: 0  . Years of education: Not on file  . Highest education level: Not on file  Occupational History  . Not on file  Social Needs  . Financial resource strain: Not on file  . Food insecurity:    Worry: Not on file    Inability: Not on file  . Transportation needs:    Medical: Not on file    Non-medical: Not on file  Tobacco Use  . Smoking status: Never Smoker  . Smokeless tobacco: Never Used  Substance and Sexual Activity  . Alcohol use: Yes    Alcohol/week: 3.0 - 4.0 standard drinks    Types: 3 - 4 Standard drinks or equivalent per week  . Drug use: No  . Sexual activity: Yes    Partners: Female    Birth control/protection: Condom  Lifestyle  . Physical activity:    Days per week: Not on file    Minutes per session: Not on file  . Stress: Not on file  Relationships  . Social connections:    Talks on phone: Not on file    Gets together: Not on file    Attends religious service: Not on file    Active member of club or organization: Not on file    Attends meetings of clubs or organizations: Not on file    Relationship status: Not on file  . Intimate partner violence:    Fear  of current or ex partner: Not on file    Emotionally abused: Not on file    Physically abused: Not on file    Forced sexual activity: Not on file  Other Topics Concern  . Not on file  Social History Narrative   ** Merged History Encounter **         PHYSICAL EXAM:  VS: There were no vitals taken for this visit. Physical Exam Gen: NAD, alert, cooperative with exam, well-appearing ENT: normal lips, normal nasal mucosa,  Eye: normal EOM, normal conjunctiva and lids CV:  no edema, +2 pedal pulses   Resp: no accessory muscle use, non-labored,  GI: no masses or tenderness, no hernia  Skin: no rashes, no areas of induration  Neuro: normal tone, normal sensation to touch Psych:  normal insight, alert and oriented MSK:  ***      ASSESSMENT & PLAN:   No problem-specific Assessment & Plan notes found for this encounter.   The above documentation has been reviewed and is accurate and complete. Clearance Coots, MD 09/17/2018, 4:40 PM>

## 2018-09-18 ENCOUNTER — Ambulatory Visit: Payer: Managed Care, Other (non HMO) | Admitting: Family Medicine

## 2018-09-18 DIAGNOSIS — Z0289 Encounter for other administrative examinations: Secondary | ICD-10-CM

## 2018-10-02 ENCOUNTER — Ambulatory Visit: Payer: Self-pay

## 2018-10-02 ENCOUNTER — Encounter: Payer: Self-pay | Admitting: Family Medicine

## 2018-10-02 ENCOUNTER — Ambulatory Visit (INDEPENDENT_AMBULATORY_CARE_PROVIDER_SITE_OTHER): Payer: BC Managed Care – PPO | Admitting: Family Medicine

## 2018-10-02 VITALS — BP 106/82 | HR 67 | Ht 68.0 in | Wt 149.0 lb

## 2018-10-02 DIAGNOSIS — M25512 Pain in left shoulder: Secondary | ICD-10-CM | POA: Insufficient documentation

## 2018-10-02 NOTE — Progress Notes (Signed)
Antonio Decker - 22 y.o. male MRN [id number]  Date of birth: [birth year]  SUBJECTIVE:  Including CC & ROS.  Chief Complaint  Patient presents with  . Shoulder Pain   I Kana Grandville Silos am serving as a Education administrator for Dr. Clearance Coots.   Antonio Decker is a 22 y.o. male that is here for left shoulder pain. Was play fighting when he injured it. States it "popped out of place".  Heard it pop. No numbness and tingling noted. Was a loss of ROM at the time. ROM is better but is uncomfortable. Has issues with completing a shoulder press.  Has limited pain now compared to previously.  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  He feels pain when he is doing a bench press.  The pain is located at the shoulder.  There is no radiation.  Denies any swelling or bruising.  Independent review of the left shoulder x-ray from 10/19 shows no acute abnormality.   Review of Systems  Constitutional: Negative for fever.  HENT: Negative for congestion.   Respiratory: Negative for cough.   Cardiovascular: Negative for chest pain.  Gastrointestinal: Negative for abdominal pain.  Musculoskeletal: Negative for gait problem.  Skin: Negative for color change.  Neurological: Negative for weakness.  Hematological: Negative for adenopathy.  Psychiatric/Behavioral: Negative for agitation.    HISTORY: Past Medical, Surgical, Social, and Family History Reviewed & Updated per EMR.   Pertinent Historical Findings include:  Past Medical History:  Diagnosis Date  . Seasonal allergies     No past surgical history on file.  No Known Allergies  Family History  Problem Relation Age of Onset  . Diabetes Maternal Grandmother   . Migraines Maternal Grandmother   . Migraines Father   . Asthma Sister   . Asthma Brother   . Cataracts Paternal Grandfather      Social History   Socioeconomic History  . Marital status: Single    Spouse name: Not on file  . Number of children: 0  . Years of  education: Not on file  . Highest education level: Not on file  Occupational History  . Not on file  Social Needs  . Financial resource strain: Not on file  . Food insecurity:    Worry: Not on file    Inability: Not on file  . Transportation needs:    Medical: Not on file    Non-medical: Not on file  Tobacco Use  . Smoking status: Never Smoker  . Smokeless tobacco: Never Used  Substance and Sexual Activity  . Alcohol use: Yes    Alcohol/week: 3.0 - 4.0 standard drinks    Types: 3 - 4 Standard drinks or equivalent per week  . Drug use: No  . Sexual activity: Yes    Partners: Female    Birth control/protection: Condom  Lifestyle  . Physical activity:    Days per week: Not on file    Minutes per session: Not on file  . Stress: Not on file  Relationships  . Social connections:    Talks on phone: Not on file    Gets together: Not on file    Attends religious service: Not on file    Active member of club or organization: Not on file    Attends meetings of clubs or organizations: Not on file    Relationship status: Not on file  . Intimate partner violence:    Fear of current or ex partner: Not on file  Emotionally abused: Not on file    Physically abused: Not on file    Forced sexual activity: Not on file  Other Topics Concern  . Not on file  Social History Narrative   ** Merged History Encounter **         PHYSICAL EXAM:  VS: BP 106/82 (BP Location: Left Arm, Patient Position: Sitting)   Pulse 67   Ht 5\' 8"  (1.727 m)   Wt 149 lb (67.6 kg)   SpO2 96%   BMI 22.66 kg/m  Physical Exam Gen: NAD, alert, cooperative with exam, well-appearing ENT: normal lips, normal nasal mucosa,  Eye: normal EOM, normal conjunctiva and lids CV:  no edema, +2 pedal pulses   Resp: no accessory muscle use, non-labored,  Skin: no rashes, no areas of induration  Neuro: normal tone, normal sensation to touch Psych:  normal insight, alert and oriented MSK:  Left shoulder: Normal  range of motion. Normal external rotation Normal strength resistance with internal and external rotation. Negative empty can Hawkins testing. Normal speeds test Feels unstable with apprehension testing Neurovascular intact  Limited ultrasound: Left shoulder:  Normal-appearing biceps tendon. Normal-appearing subscapularis. Supraspinatus with changes consistent with tendinopathy but no tear appreciated. Normal-appearing posterior glenohumeral joint  Summary: Mild supraspinatus tendinopathy  Ultrasound and interpretation by Clearance Coots, MD      ASSESSMENT & PLAN:   Acute pain of left shoulder Had a traumatic event where he feels that the shoulder became dislocated about a month ago.  Had significant pain during that time but the pain has improved today.  Imaging today does not show any lesions. -Counseled on home exercise therapy and supportive care. -We will pursue an MRI to evaluate for any Bankart lesion or labral tear based on traumatic injury.   The above documentation has been reviewed and is accurate and complete. Clearance Coots, MD 10/02/2018, 9:59 AM>

## 2018-10-02 NOTE — Assessment & Plan Note (Signed)
Had a traumatic event where he feels that the shoulder became dislocated about a month ago.  Had significant pain during that time but the pain has improved today.  Imaging today does not show any lesions. -Counseled on home exercise therapy and supportive care. -We will pursue an MRI to evaluate for any Bankart lesion or labral tear based on traumatic injury.

## 2018-10-02 NOTE — Patient Instructions (Signed)
Nice to meet you  Please work on the rehab exercises  You will get a call to schedule the MRI. I will call you with the results after it is completed.

## 2018-10-09 ENCOUNTER — Ambulatory Visit
Admission: RE | Admit: 2018-10-09 | Discharge: 2018-10-09 | Disposition: A | Discharge status: Home / Self Care | Payer: BC Managed Care – PPO | Source: Ambulatory Visit | Attending: Family Medicine | Admitting: Family Medicine

## 2018-10-09 DIAGNOSIS — M25512 Pain in left shoulder: Secondary | ICD-10-CM

## 2018-10-12 ENCOUNTER — Telehealth: Payer: Self-pay | Admitting: Family Medicine

## 2018-10-12 DIAGNOSIS — M25512 Pain in left shoulder: Secondary | ICD-10-CM

## 2018-10-12 NOTE — Telephone Encounter (Signed)
Spoke with patient about MRI. Will try physical therapy.   Rosemarie Ax, MD [facility] 10/12/2018, 10:36 AM

## 2018-11-07 IMAGING — DX DG SHOULDER 2+V*L*
3 series · 3 of 3 positions shown · non-contrast
Comparison: None.

CLINICAL DATA: Injury of left shoulder, initial encounter.

EXAM:
LEFT SHOULDER - 2+ VIEW

[shoulder ap]
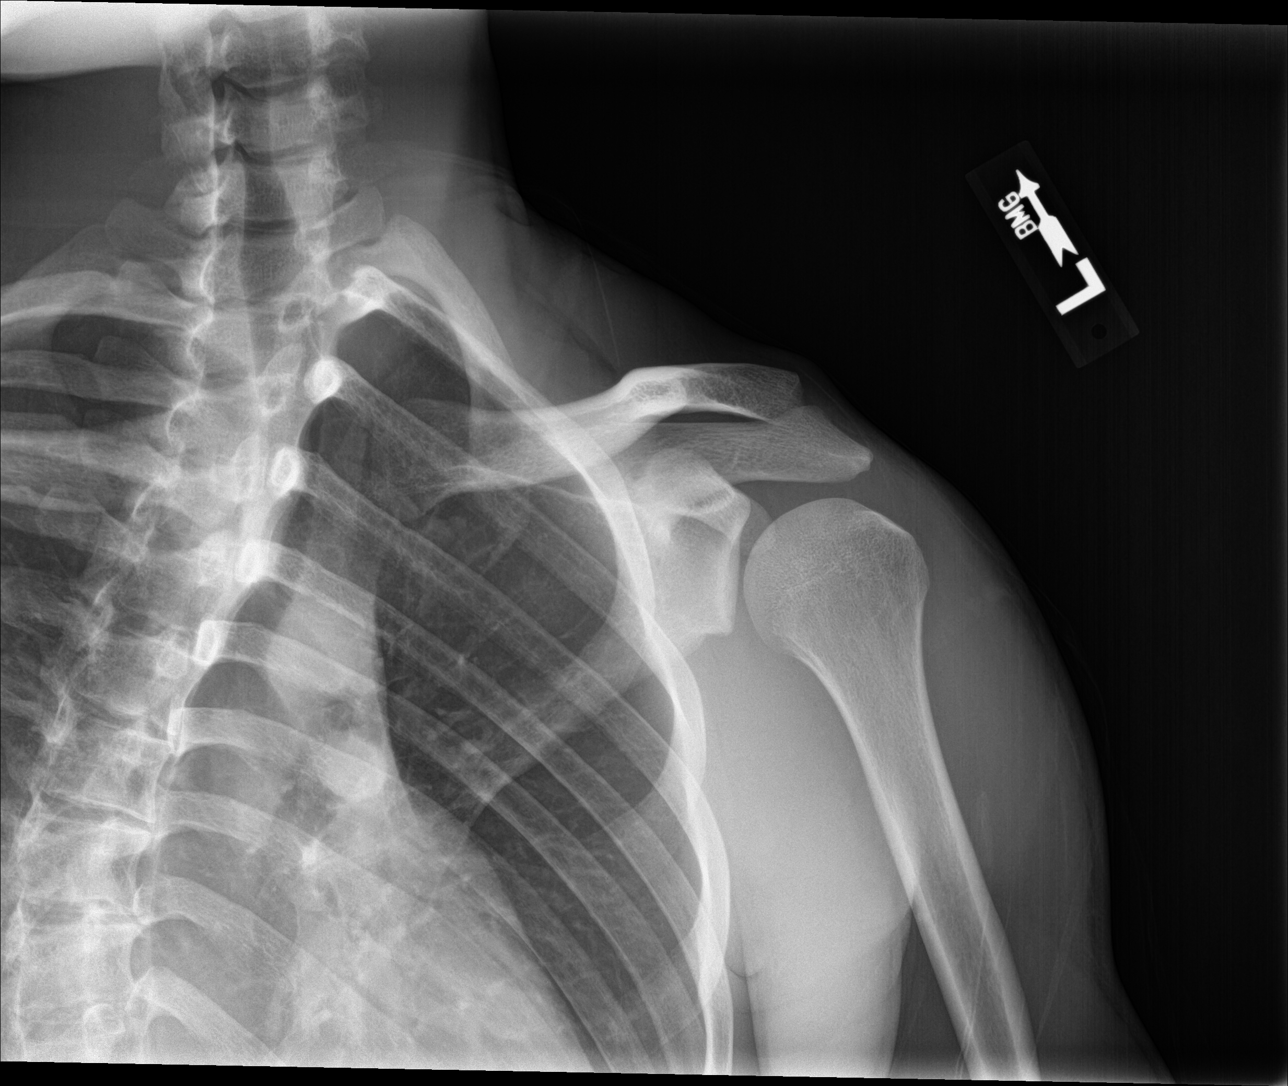

[shoulder y-view]
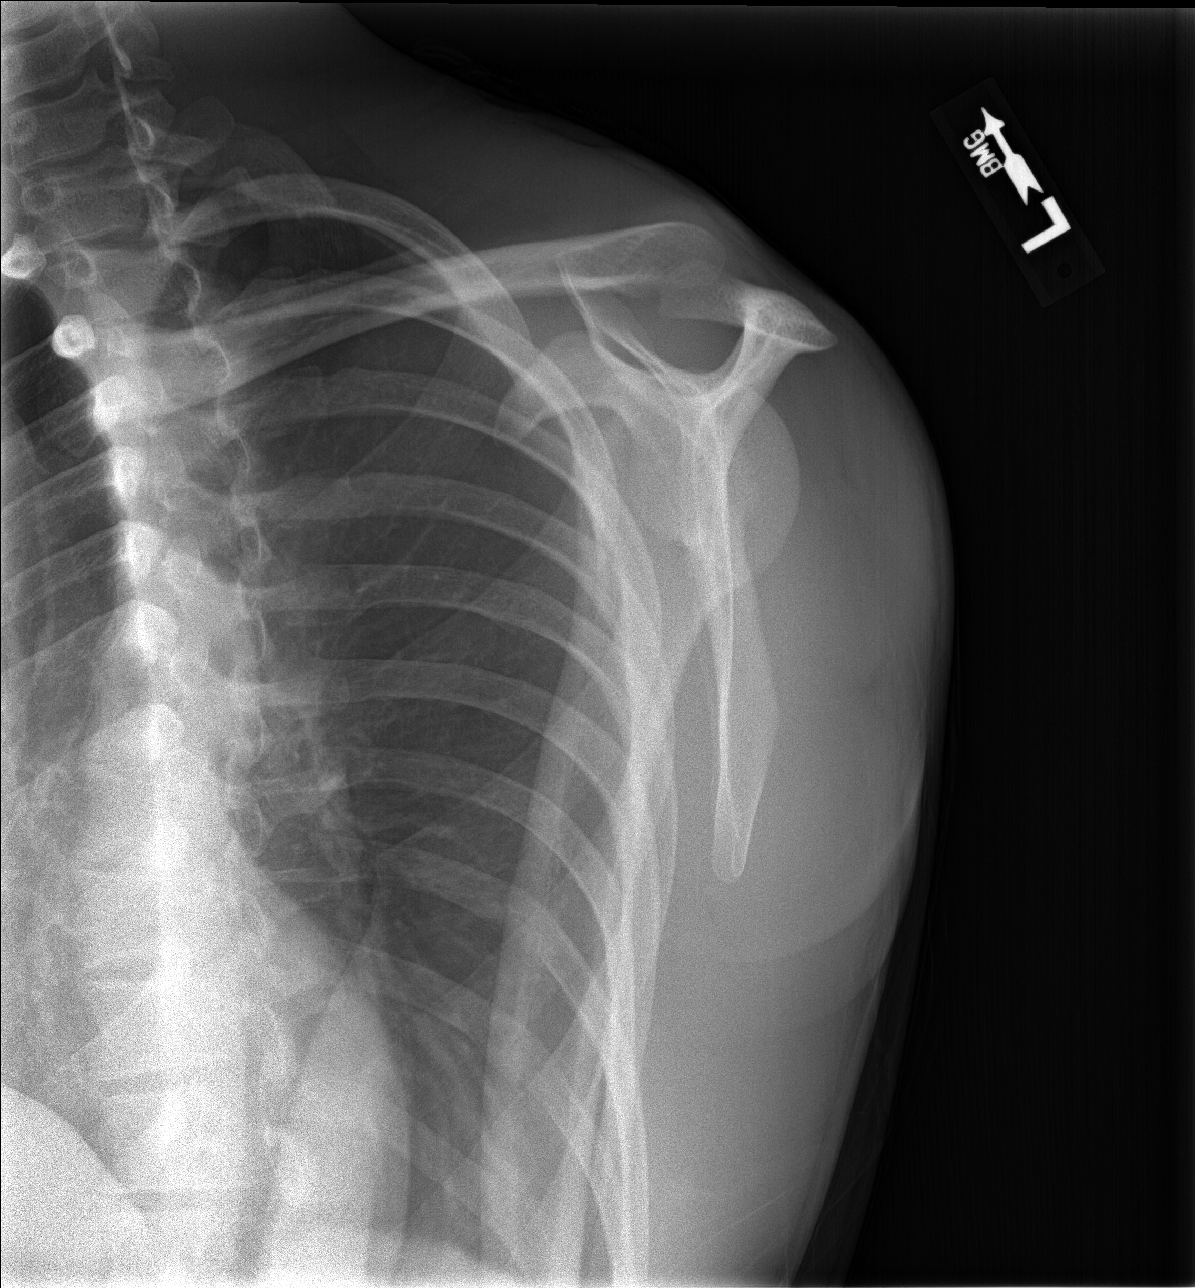

[shoulder axial]
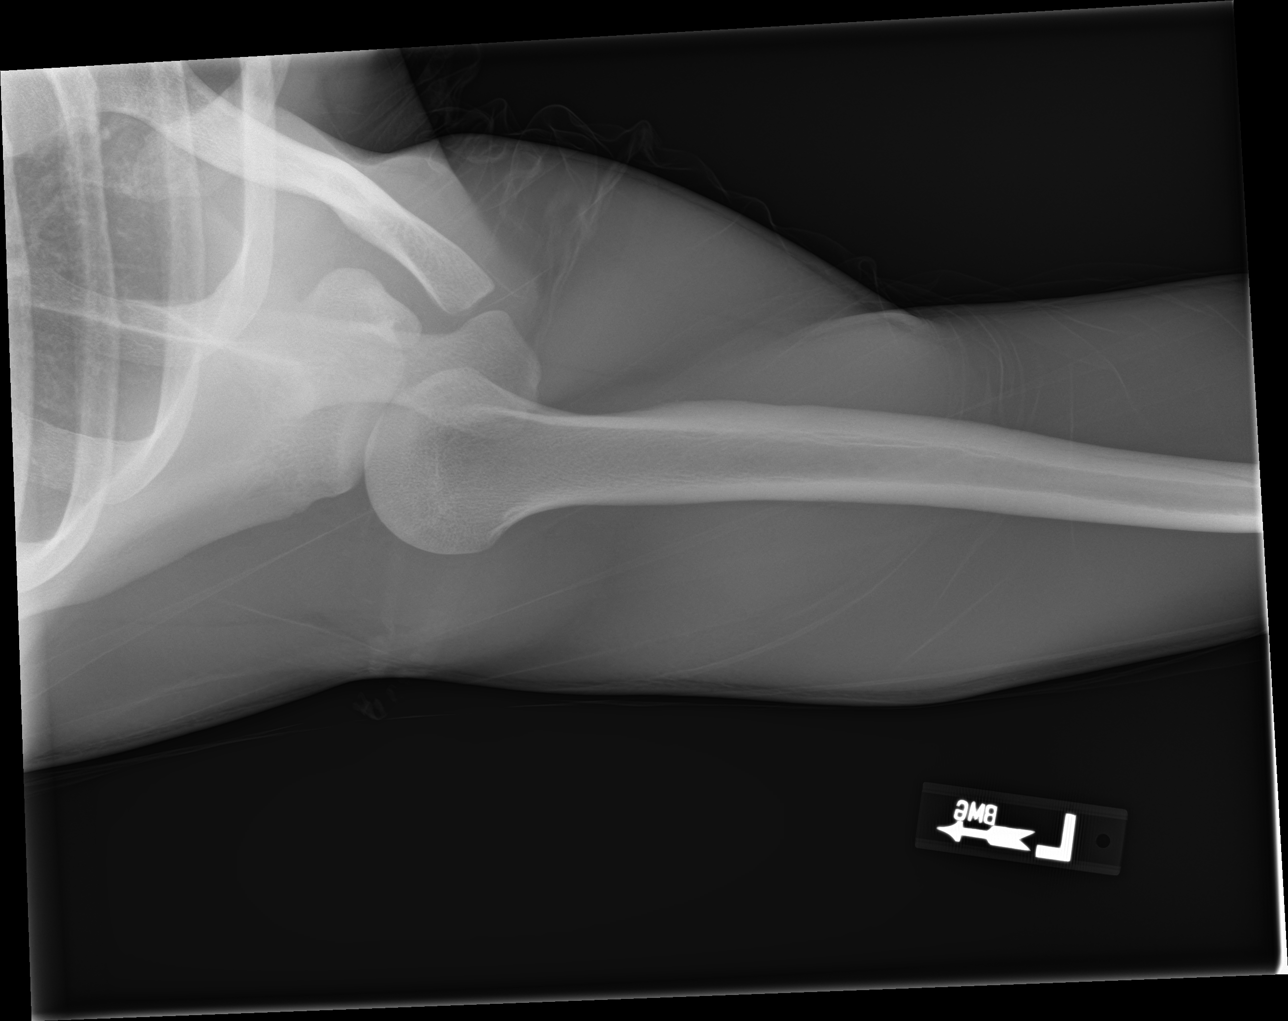

[3 of 3 positions shown; findings below may reference images not displayed]

FINDINGS: Left shoulder is located without fracture. Normal appearance of the
left AC joint. Visualized left ribs are intact.
IMPRESSION: Negative.

## 2019-02-05 ENCOUNTER — Other Ambulatory Visit: Payer: Self-pay

## 2019-02-05 ENCOUNTER — Ambulatory Visit (INDEPENDENT_AMBULATORY_CARE_PROVIDER_SITE_OTHER): Payer: BC Managed Care – PPO | Admitting: Family Medicine

## 2019-02-05 ENCOUNTER — Encounter: Payer: Self-pay | Admitting: Family Medicine

## 2019-02-05 VITALS — BP 110/80 | HR 74 | Temp 98.4°F | Ht 67.0 in | Wt 150.0 lb

## 2019-02-05 DIAGNOSIS — R41840 Attention and concentration deficit: Secondary | ICD-10-CM

## 2019-02-05 DIAGNOSIS — Z0001 Encounter for general adult medical examination with abnormal findings: Secondary | ICD-10-CM

## 2019-02-05 DIAGNOSIS — J309 Allergic rhinitis, unspecified: Secondary | ICD-10-CM

## 2019-02-05 DIAGNOSIS — Z0000 Encounter for general adult medical examination without abnormal findings: Secondary | ICD-10-CM

## 2019-02-05 DIAGNOSIS — Z113 Encounter for screening for infections with a predominantly sexual mode of transmission: Secondary | ICD-10-CM

## 2019-02-05 DIAGNOSIS — J4520 Mild intermittent asthma, uncomplicated: Secondary | ICD-10-CM

## 2019-02-05 NOTE — Patient Instructions (Addendum)
flonase and antihistamine as needed.  Use technique as we discussed.   Albuterol as needed. Return if increased need.   Adult vaccine recommendations: [email or web address] Let me know if you are not up to date on your records and we can order those here.   [facility]  Address: [address], [place], [place] [address]  [email or web address]  Phone: [phone]   [facility] [employer]  Address: [address], [place], [place] [address] Phone: [phone]   For ADHD testing: [facility]  [phone]   Keeping you healthy  Get these tests  Blood pressure- Have your blood pressure checked once a year by your healthcare provider.  Normal blood pressure is 120/80.  Weight- Have your body mass index (BMI) calculated to screen for obesity.  BMI is a measure of body fat based on height and weight. You can also calculate your own BMI at GravelBags.it.  Cholesterol- Have your cholesterol checked regularly starting at age 50, sooner may be necessary if you have diabetes, high blood pressure, if a family member developed heart diseases at an early age or if you smoke.   Chlamydia, HIV, and other sexual transmitted disease- Get screened each year until the age of 40 then within three months of each new sexual partner.  Diabetes- Have your blood sugar checked regularly if you have high blood pressure, high cholesterol, a family history of diabetes or if you are overweight.  Get these vaccines  Flu shot- Every fall.  Tetanus shot- Every 10 years.  Menactra- Single dose; prevents meningitis.  Take these steps  Don't smoke- If you do smoke, ask your healthcare provider about quitting. For tips on how to quit, go to www.smokefree.gov or call 1-800-QUIT-NOW.  Be physically active- Exercise 5 days a week for at least 30 minutes.  If you are not already physically active start slow and gradually work up  to 30 minutes of moderate physical activity.  Examples of moderate activity include walking briskly, mowing the yard, dancing, swimming bicycling, etc.  Eat a healthy diet- Eat a variety of healthy foods such as fruits, vegetables, low fat milk, low fat cheese, yogurt, lean meats, poultry, fish, beans, tofu, etc.  For more information on healthy eating, go to www.thenutritionsource.org  Drink alcohol in moderation- Limit alcohol intake two drinks or less a day.  Never drink and drive.  Dentist- Brush and floss teeth twice daily; visit your dentis twice a year.  Depression-Your emotional health is as important as your physical health.  If you're feeling down, losing interest in things you normally enjoy please talk with your healthcare provider.  Gun Safety- If you keep a gun in your home, keep it unloaded and with the safety lock on.  Bullets should be stored separately.  Helmet use- Always wear a helmet when riding a motorcycle, bicycle, rollerblading or skateboarding.  Safe sex- If you may be exposed to a sexually transmitted infection, use a condom  Seat belts- Seat bels can save your life; always wear one.  Smoke/Carbon Monoxide detectors- These detectors need to be installed on the appropriate level of your home.  Replace batteries at least once a year.  Skin Cancer- When out in the sun, cover up and use sunscreen SPF 15 or higher.  Violence- If anyone is threatening or hurting you, please tell your healthcare provider.   Allergic Rhinitis, Adult Allergic rhinitis is an allergic reaction that affects the mucous membrane inside the nose. It causes sneezing, a runny or  stuffy nose, and the feeling of mucus going down the back of the throat (postnasal drip). Allergic rhinitis can be mild to severe. There are two types of allergic rhinitis:  Seasonal. This type is also called hay fever. It happens only during certain seasons.  Perennial. This type can happen at any time of the  year. What are the causes? This condition happens when the body's defense system (immune system) responds to certain harmless substances called allergens as though they were germs.  Seasonal allergic rhinitis is triggered by pollen, which can come from grasses, trees, and weeds. Perennial allergic rhinitis may be caused by:  House dust mites.  Pet dander.  Mold spores. What are the signs or symptoms? Symptoms of this condition include:  Sneezing.  Runny or stuffy nose (nasal congestion).  Postnasal drip.  Itchy nose.  Tearing of the eyes.  Trouble sleeping.  Daytime sleepiness. How is this diagnosed? This condition may be diagnosed based on:  Your medical history.  A physical exam.  Tests to check for related conditions, such as: ? Asthma. ? Pink eye. ? Ear infection. ? Upper respiratory infection.  Tests to find out which allergens trigger your symptoms. These may include skin or blood tests. How is this treated? There is no cure for this condition, but treatment can help control symptoms. Treatment may include:  Taking medicines that block allergy symptoms, such as antihistamines. Medicine may be given as a shot, nasal spray, or pill.  Avoiding the allergen.  Desensitization. This treatment involves getting ongoing shots until your body becomes less sensitive to the allergen. This treatment may be done if other treatments do not help.  If taking medicine and avoiding the allergen does not work, new, stronger medicines may be prescribed. Follow these instructions at home:  Find out what you are allergic to. Common allergens include smoke, dust, and pollen.  Avoid the things you are allergic to. These are some things you can do to help avoid allergens: ? Replace carpet with wood, tile, or vinyl flooring. Carpet can trap dander and dust. ? Do not smoke. Do not allow smoking in your home. ? Change your heating and air conditioning filter at least once a  month. ? During allergy season:  Keep windows closed as much as possible.  Plan outdoor activities when pollen counts are lowest. This is usually during the evening hours.  When coming indoors, change clothing and shower before sitting on furniture or bedding.  Take over-the-counter and prescription medicines only as told by your health care provider.  Keep all follow-up visits as told by your health care provider. This is important. Contact a health care provider if:  You have a fever.  You develop a persistent cough.  You make whistling sounds when you breathe (you wheeze).  Your symptoms interfere with your normal daily activities. Get help right away if:  You have shortness of breath. Summary  This condition can be managed by taking medicines as directed and avoiding allergens.  Contact your health care provider if you develop a persistent cough or fever.  During allergy season, keep windows closed as much as possible. This information is not intended to replace advice given to you by your health care provider. Make sure you discuss any questions you have with your health care provider. Document Released: 07/30/2001 Document Revised: 12/12/2016 Document Reviewed: 12/12/2016 Elsevier Interactive Patient Education  [employer].     If you have lab work done today you will be contacted with  your lab results within the next 2 weeks.  If you have not heard from [place] then please contact us. The fastest way to get your results is to register for My Chart.   IF you received an x-ray today, you will receive an invoice from [facility]. Please contact [facility] at [phone] with questions or concerns regarding your invoice.   IF you received labwork today, you will receive an invoice from [facility]. Please contact [facility] at [phone] with questions or concerns regarding your invoice.   Our billing staff will not be able to assist you with  questions regarding bills from these companies.  You will be contacted with the lab results as soon as they are available. The fastest way to get your results is to activate your My Chart account. Instructions are located on the last page of this paperwork. If you have not heard from [place] regarding the results in 2 weeks, please contact this office.

## 2019-02-05 NOTE — Progress Notes (Signed)
Subjective:    Patient ID: Antonio Decker, male    DOB: [birth year], 23 y.o.   MRN: [id number]  HPI Antonio Decker is a 23 y.o. male Presents today for: Chief Complaint  Patient presents with  . Annual Exam    CPE. request STD check for routine    All rhinitis: Seasonal - worse with spring and fall. PND with some mucus.  No regular meds - claritin and allegra - min relief. Min relief with flonase - min relief.  Met with allergist in past - no specific allergen.   Asthma: Mild intermittent.  Rare use albuterol. Last use in January. No inhaler needed at this time.   STI testing. No symptoms.  Sexually active with females,  No new partners, same partner year and a half - unprotected intercourse at times.   Immunization History  Administered Date(s) Administered  . Influenza,inj,Quad PF,6+ Mos 09/05/2018  . Influenza-Unspecified 11/22/2016  . Tdap 11/18/2010, 12/27/2016  has shot record at home.   Depression screen [facility] 2/9 02/05/2019 09/05/2018 06/18/2018 06/12/2018 05/18/2018  Decreased Interest 0 0 0 0 0  Down, Depressed, Hopeless 0 0 0 0 0  PHQ - 2 Score 0 0 0 0 0   Dentist: has ongoing care with prior dentist in [place] - summer 2019.   Exercise: 4-5 days per week. At least 176mn week. 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   Prior history of ADHD, testing in 3rd grade?  [place] med[place] during [place] Took self off meds in Sophomore year of high school, [employer] [place] [employer] [place] 6 [place]. Decent college grades. Played catch up in college.  Trouble with concentration now. Trouble reading book, staying on task. Easily distractible.  No recent meds.  aderall caused insomnia. Problem with ritalin, vyvanse tolerated.   No heart disease, diabetes, chronic issues known.    Patient Active Problem List   Diagnosis Date Noted  . Acute pain of left shoulder 10/02/2018  . Mild intermittent asthma, uncomplicated 017/51/0258 . Seasonal and perennial allergic  rhinitis 01/01/2018  . Screening for STD (sexually transmitted disease) 12/27/2016  . Allergic rhinitis due to allergen 12/27/2016   Past Medical History:  Diagnosis Date  . Seasonal allergies    No past surgical history on file. No Known Allergies Prior to Admission medications   Not on File   Social History   Socioeconomic History  . Marital status: Single    Spouse name: Not on file  . Number of children: 0  . Years of education: Not on file  . Highest education level: Not on file  Occupational History  . Not on file  Social Needs  . Financial resource strain: Not on file  . Food insecurity:    Worry: Not on file    Inability: Not on file  . Transportation needs:    Medical: Not on file    Non-medical: Not on file  Tobacco Use  . Smoking status: Never Smoker  . Smokeless tobacco: Never Used  Substance and Sexual Activity  . Alcohol use: Yes    Alcohol/week: 3.0 - 4.0 standard drinks    Types: 3 - 4 Standard drinks or equivalent per week  . Drug use: No  . Sexual activity: Yes    Partners: Female    Birth control/protection: Condom  Lifestyle  . Physical activity:    Days per week: Not on file    Minutes per session: Not on file  . Stress: Not on file  Relationships  . Social connections:    Talks on phone: Not on file    Gets together: Not on file    Attends religious service: Not on file    Active member of club or organization: Not on file    Attends meetings of clubs or organizations: Not on file    Relationship status: Not on file  . Intimate partner violence:    Fear of current or ex partner: Not on file    Emotionally abused: Not on file    Physically abused: Not on file    Forced sexual activity: Not on file  Other Topics Concern  . Not on file  Social History Narrative   ** Merged History Encounter **        Review of Systems 13 point review of systems per patient health survey noted.  Negative other than as indicated above or in HPI.       Objective:   Physical Exam Vitals signs reviewed.  Constitutional:      Appearance: He is well-developed.  HENT:     Head: Normocephalic and atraumatic.     Right Ear: External ear normal.     Left Ear: External ear normal.  Eyes:     Conjunctiva/sclera: Conjunctivae normal.     Pupils: Pupils are equal, round, and reactive to light.  Neck:     Musculoskeletal: Normal range of motion and neck supple.     Thyroid: No thyromegaly.  Cardiovascular:     Rate and Rhythm: Normal rate and regular rhythm.     Heart sounds: Normal heart sounds.  Pulmonary:     Effort: Pulmonary effort is normal. No respiratory distress.     Breath sounds: Normal breath sounds. No wheezing.  Abdominal:     General: There is no distension.     Palpations: Abdomen is soft.     Tenderness: There is no abdominal tenderness.  Musculoskeletal: Normal range of motion.        General: No tenderness.  Lymphadenopathy:     Cervical: No cervical adenopathy.  Skin:    General: Skin is warm and dry.  Neurological:     Mental Status: He is alert and oriented to person, place, and time.     Deep Tendon Reflexes: Reflexes are normal and symmetric.  Psychiatric:        Behavior: Behavior normal.    Vitals:   02/05/19 1540  BP: 110/80  Pulse: 74  Temp: 98.4 F (36.9 C)  TempSrc: Oral  SpO2: 98%  Weight: 150 lb (68 kg)  Height: _0  (1.702 m)       Assessment & Plan:  Antonio Decker is a 23 y.o. male Annual physical exam - Plan: Trichomonas vaginalis, RNA  - -anticipatory guidance as below in AVS, screening labs above. Health maintenance items as above in HPI discussed/recommended as applicable.   Difficulty concentrating  -Phone number provided for [place] [facility] for formal evaluation and discussion of treatments  Routine screening for STI (sexually transmitted infection) - Plan: GC/Chlamydia Probe Amp, HIV Antibody (routine testing w rflx), RPR, CANCELED: Trichomonas vaginalis RNA,  Ql,Males  -Safer sex practices discussed, testing as above.  Allergic rhinitis, unspecified seasonality, unspecified trigger  -Steroid nasal sprays discussed over-the-counter with correct use for improved adherence  Mild intermittent asthma without complication  -Has albuterol if needed for flare, mild symptoms at this time with RTC precautions given  No orders of the defined types were placed in this encounter.  Patient Instructions  flonase and antihistamine as needed.  Use technique as we discussed.   Albuterol as needed. Return if increased need.   Adult vaccine recommendations: [email or web address] Let me know if you are not up to date on your records and we can order those here.   [facility]  Address: [address], [place], [place] [address]  [email or web address]  Phone: [phone]   [facility]  Address: [address], [place], [place] [address] Phone: [phone]   For ADHD testing: [facility]  [phone]   Keeping you healthy  Get these tests  Blood pressure- Have your blood pressure checked once a year by your healthcare provider.  Normal blood pressure is 120/80.  Weight- Have your body mass index (BMI) calculated to screen for obesity.  BMI is a measure of body fat based on height and weight. You can also calculate your own BMI at [email or web address].  Cholesterol- Have your cholesterol checked regularly starting at age 12, sooner may be necessary if you have diabetes, high blood pressure, if a family member developed heart diseases at an early age or if you smoke.   Chlamydia, HIV, and other sexual transmitted disease- Get screened each year until the age of 30 then within three months of each new sexual partner.  Diabetes- Have your blood sugar checked regularly if you have high blood pressure, high cholesterol, a family history of diabetes or if you are  overweight.  Get these vaccines  Flu shot- Every fall.  Tetanus shot- Every 10 years.  Menactra- Single dose; prevents meningitis.  Take these steps  Don't smoke- If you do smoke, ask your healthcare provider about quitting. For tips on how to quit, go to www.smokefree.gov or call 1-800-QUIT-NOW.  Be physically active- Exercise 5 days a week for at least 30 minutes.  If you are not already physically active start slow and gradually work up to 30 minutes of moderate physical activity.  Examples of moderate activity include walking briskly, mowing the yard, dancing, swimming bicycling, etc.  Eat a healthy diet- Eat a variety of healthy foods such as fruits, vegetables, low fat milk, low fat cheese, yogurt, lean meats, poultry, fish, beans, tofu, etc.  For more information on healthy eating, go to www.thenutritionsource.org  Drink alcohol in moderation- Limit alcohol intake two drinks or less a day.  Never drink and drive.  Dentist- Brush and floss teeth twice daily; visit your dentis twice a year.  Depression-Your emotional health is as important as your physical health.  If you're feeling down, losing interest in things you normally enjoy please talk with your healthcare provider.  Gun Safety- If you keep a gun in your home, keep it unloaded and with the safety lock on.  Bullets should be stored separately.  Helmet use- Always wear a helmet when riding a motorcycle, bicycle, rollerblading or skateboarding.  Safe sex- If you may be exposed to a sexually transmitted infection, use a condom  Seat belts- Seat bels can save your life; always wear one.  Smoke/Carbon Monoxide detectors- These detectors need to be installed on the appropriate level of your home.  Replace batteries at least once a year.  Skin Cancer- When out in the sun, cover up and use sunscreen SPF 15 or higher.  Violence- If anyone is threatening or hurting you, please tell your healthcare provider.   Allergic  Rhinitis, Adult Allergic rhinitis is an allergic reaction that affects the mucous membrane inside the nose. It causes sneezing, a runny  or stuffy nose, and the feeling of mucus going down the back of the throat (postnasal drip). Allergic rhinitis can be mild to severe. There are two types of allergic rhinitis:  Seasonal. This type is also called hay fever. It happens only during certain seasons.  Perennial. This type can happen at any time of the year. What are the causes? This condition happens when the body's defense system (immune system) responds to certain harmless substances called allergens as though they were germs.  Seasonal allergic rhinitis is triggered by pollen, which can come from grasses, trees, and weeds. Perennial allergic rhinitis may be caused by:  House dust mites.  Pet dander.  Mold spores. What are the signs or symptoms? Symptoms of this condition include:  Sneezing.  Runny or stuffy nose (nasal congestion).  Postnasal drip.  Itchy nose.  Tearing of the eyes.  Trouble sleeping.  Daytime sleepiness. How is this diagnosed? This condition may be diagnosed based on:  Your medical history.  A physical exam.  Tests to check for related conditions, such as: ? Asthma. ? Pink eye. ? Ear infection. ? Upper respiratory infection.  Tests to find out which allergens trigger your symptoms. These may include skin or blood tests. How is this treated? There is no cure for this condition, but treatment can help control symptoms. Treatment may include:  Taking medicines that block allergy symptoms, such as antihistamines. Medicine may be given as a shot, nasal spray, or pill.  Avoiding the allergen.  Desensitization. This treatment involves getting ongoing shots until your body becomes less sensitive to the allergen. This treatment may be done if other treatments do not help.  If taking medicine and avoiding the allergen does not work, new, stronger medicines  may be prescribed. Follow these instructions at home:  Find out what you are allergic to. Common allergens include smoke, dust, and pollen.  Avoid the things you are allergic to. These are some things you can do to help avoid allergens: ? Replace carpet with wood, tile, or vinyl flooring. Carpet can trap dander and dust. ? Do not smoke. Do not allow smoking in your home. ? Change your heating and air conditioning filter at least once a month. ? During allergy season:  Keep windows closed as much as possible.  Plan outdoor activities when pollen counts are lowest. This is usually during the evening hours.  When coming indoors, change clothing and shower before sitting on furniture or bedding.  Take over-the-counter and prescription medicines only as told by your health care provider.  Keep all follow-up visits as told by your health care provider. This is important. Contact a health care provider if:  You have a fever.  You develop a persistent cough.  You make whistling sounds when you breathe (you wheeze).  Your symptoms interfere with your normal daily activities. Get help right away if:  You have shortness of breath. Summary  This condition can be managed by taking medicines as directed and avoiding allergens.  Contact your health care provider if you develop a persistent cough or fever.  During allergy season, keep windows closed as much as possible. This information is not intended to replace advice given to you by your health care provider. Make sure you discuss any questions you have with your health care provider. Document Released: 07/30/2001 Document Revised: 12/12/2016 Document Reviewed: 12/12/2016 Elsevier Interactive Patient Education  [employer].     If you have lab work done today you will be contacted with  your lab results within the next 2 weeks.  If you have not heard from [place] then please contact us. The fastest way to get your results is to  register for My Chart.   IF you received an x-ray today, you will receive an invoice from [facility]. Please contact [facility] at [phone] with questions or concerns regarding your invoice.   IF you received labwork today, you will receive an invoice from [facility]. Please contact [facility] at [phone] with questions or concerns regarding your invoice.   Our billing staff will not be able to assist you with questions regarding bills from these companies.  You will be contacted with the lab results as soon as they are available. The fastest way to get your results is to activate your My Chart account. Instructions are located on the last page of this paperwork. If you have not heard from [place] regarding the results in 2 weeks, please contact this office.       Signed,   Merri Ray, MD [facility].  02/05/19 6:53 PM

## 2019-02-06 LAB — RPR: RPR Ser Ql: NONREACTIVE

## 2019-02-06 LAB — HIV ANTIBODY (ROUTINE TESTING W REFLEX): HIV Screen 4th Generation wRfx: NONREACTIVE

## 2019-02-07 LAB — GC/CHLAMYDIA PROBE AMP
CHLAMYDIA, DNA PROBE: NEGATIVE
Neisseria gonorrhoeae by PCR: NEGATIVE

## 2019-02-07 LAB — TRICHOMONAS VAGINALIS, PROBE AMP: TRICH VAG BY NAA: NEGATIVE

## 2019-05-25 ENCOUNTER — Other Ambulatory Visit: Payer: Self-pay | Admitting: *Deleted

## 2019-05-25 DIAGNOSIS — Z20822 Contact with and (suspected) exposure to covid-19: Secondary | ICD-10-CM

## 2019-05-30 LAB — NOVEL CORONAVIRUS, NAA: SARS-CoV-2, NAA: NOT DETECTED

## 2019-06-11 ENCOUNTER — Telehealth: Payer: BC Managed Care – PPO | Admitting: Family

## 2019-06-11 DIAGNOSIS — R0600 Dyspnea, unspecified: Secondary | ICD-10-CM

## 2019-06-11 MED ORDER — ALBUTEROL SULFATE HFA 108 (90 BASE) MCG/ACT IN AERS: 2.0000 | INHALATION_SPRAY | Freq: Four times a day (QID) | RESPIRATORY_TRACT | 0 refills | Status: AC | PRN

## 2019-06-11 NOTE — Progress Notes (Signed)
E Visit for Asthma  Based on what you have shared with me, it looks like you may have a flare up of your asthma.  Asthma is a chronic (ongoing) lung disease which results in airway obstruction, inflammation and hyper-responsiveness.   Asthma symptoms vary from person to person, with common symptoms including nighttime awakening and decreased ability to participate in normal activities as a result of shortness of breath. It is often triggered by changes in weather, changes in the season, changes in air temperature, or inside (home, school, daycare or work) allergens such as animal dander, mold, mildew, woodstoves or cockroaches.   It can also be triggered by hormonal changes, extreme emotion, physical exertion or an upper respiratory tract illness.     It is important to identify the trigger, and then eliminate or avoid the trigger if possible.   If you have been prescribed medications to be taken on a regular basis, it is important to follow the asthma action plan and to follow guidelines to adjust medication in response to increasing symptoms of decreased peak expiratory flow rate  Treatment: I have prescribed: Albuterol (Proventil HFA; Ventolin HFA) 108 (90 Base) MCG/ACT Inhaler 2 puffs into the lungs every six hours as needed for wheezing or shortness of breath  HOME CARE . Only take medications as instructed by your medical team. . Consider wearing a mask or scarf to improve breathing air temperature have been shown to decrease irritation and decrease exacerbations . Get rest. . Taking a steamy shower or using a humidifier may help nasal congestion sand ease sore throat pain. You can place a towel over your head and breathe in the steam from hot water coming from a faucet. . Using a saline nasal spray works much the same way.  . Cough drops, hare candies and sore throat lozenges may  ease your cough.  . Avoid close contacts especially the very you and the elderly . Cover your mouth if you cough or sneeze . Always remember to wash your hands.    GET HELP RIGHT AWAY IF: . You develop worsening symptoms; breathlessness at rest, drowsy, confused or agitated, unable to speak in full sentences . You have coughing fits . You develop a severe headache or visual changes . You develop shortness of breath, difficulty breathing or start having chest pain . Your symptoms persist after you have completed your treatment plan . If your symptoms do not improve within 10 days  MAKE SURE YOU . Understand these instructions. . Will watch your condition. . Will get help right away if you are not doing well or get worse.   Your e-visit answers were reviewed by a board certified advanced clinical practitioner to complete your personal care plan, Depending upon the condition, your plan could have included both over the counter or prescription medications.  Please review your pharmacy choice. Your safety is important to [place]. If you have drug allergies check your prescription carefully. You can use MyChart to ask questions about today's visit, request a non-urgent call back, or ask for a work or school excuse for 24 hours related to this e-Visit. If it has been greater than 24 hours you will need to follow up with your provider, or enter a new e-Visit to address those concerns.  You will get an e-mail in the next two days asking about your experience. I hope that your e-visit has been valuable and will speed your recovery. Thank you for using e-visits.  Greater than 5 minutes, yet  less than 10 minutes of time have been spent researching, coordinating, and implementing care for this patient today.  Thank you for the details you included in the comment boxes. Those details are very helpful in determining the best course of treatment for you and help us to provide the best care.  

## 2019-06-26 ENCOUNTER — Other Ambulatory Visit: Payer: Self-pay | Admitting: Family

## 2019-09-21 ENCOUNTER — Other Ambulatory Visit: Payer: Self-pay

## 2019-09-21 ENCOUNTER — Ambulatory Visit (INDEPENDENT_AMBULATORY_CARE_PROVIDER_SITE_OTHER): Payer: BC Managed Care – PPO | Admitting: Registered Nurse

## 2019-09-21 ENCOUNTER — Encounter: Payer: Self-pay | Admitting: Registered Nurse

## 2019-09-21 VITALS — BP 115/76 | HR 69 | Temp 98.7°F | Resp 18 | Ht 67.0 in | Wt 149.8 lb

## 2019-09-21 DIAGNOSIS — L608 Other nail disorders: Secondary | ICD-10-CM | POA: Diagnosis not present

## 2019-09-21 NOTE — Patient Instructions (Signed)
° ° ° °  If you have lab work done today you will be contacted with your lab results within the next 2 weeks.  If you have not heard from us then please contact us. The fastest way to get your results is to register for My Chart. ° ° °IF you received an x-ray today, you will receive an invoice from [facility]. Please contact [facility] at [phone] with questions or concerns regarding your invoice.  ° °IF you received labwork today, you will receive an invoice from [facility]. Please contact [facility] at [phone] with questions or concerns regarding your invoice.  ° °Our billing staff will not be able to assist you with questions regarding bills from these companies. ° °You will be contacted with the lab results as soon as they are available. The fastest way to get your results is to activate your My Chart account. Instructions are located on the last page of this paperwork. If you have not heard from us regarding the results in 2 weeks, please contact this office. °  ° ° ° °

## 2019-09-21 NOTE — Progress Notes (Signed)
Established Patient Office Visit  Subjective:  Patient ID: Antonio Decker, male    DOB: [birth year]  Age: 23 y.o. MRN: [id number]  CC:  Chief Complaint  Patient presents with  . Nail Problem    pt needs a referral to Dermatology for toe nail problem.     HPI [name] presents for concern for dark streak on nail. Has been present for 1+ year. Has seen a podiatrist, but unfortunately no adequate treatment was provided. He does not remember what the treatment was, but remembers that he had to go to a compounding pharmacy to get the medication.  He also wishes to establish myself as PCP. No updates upon review of history. Only med is albuterol inhaler PRN, does not use frequently, does not need refill at this time. Last CPE in March with Dr. Carlota Raspberry, rec'd STD testing at that visit.   Past Medical History:  Diagnosis Date  . Seasonal allergies     History reviewed. No pertinent surgical history.  Family History  Problem Relation Age of Onset  . Diabetes Maternal Grandmother   . Migraines Maternal Grandmother   . Migraines Father   . Asthma Sister   . Asthma Brother   . Cataracts Paternal Grandfather     Social History   Socioeconomic History  . Marital status: Single    Spouse name: Not on file  . Number of children: 0  . Years of education: Not on file  . Highest education level: Not on file  Occupational History  . Not on file  Social Needs  . Financial resource strain: Not on file  . Food insecurity    Worry: Not on file    Inability: Not on file  . Transportation needs    Medical: Not on file    Non-medical: Not on file  Tobacco Use  . Smoking status: Never Smoker  . Smokeless tobacco: Never Used  Substance and Sexual Activity  . Alcohol use: Yes    Alcohol/week: 3.0 - 4.0 standard drinks    Types: 3 - 4 Standard drinks or equivalent per week  . Drug use: No  . Sexual activity: Yes    Partners: Female    Birth control/protection: Condom  Lifestyle  .  Physical activity    Days per week: Not on file    Minutes per session: Not on file  . Stress: Not on file  Relationships  . Social Herbalist on phone: Not on file    Gets together: Not on file    Attends religious service: Not on file    Active member of club or organization: Not on file    Attends meetings of clubs or organizations: Not on file    Relationship status: Not on file  . Intimate partner violence    Fear of current or ex partner: Not on file    Emotionally abused: Not on file    Physically abused: Not on file    Forced sexual activity: Not on file  Other Topics Concern  . Not on file  Social History Narrative   ** Merged History Encounter **        Outpatient Medications Prior to Visit  Medication Sig Dispense Refill  . albuterol (VENTOLIN HFA) 108 (90 Base) MCG/ACT inhaler Inhale 2 puffs into the lungs every 6 (six) hours as needed for wheezing or shortness of breath. 16 g 0   No facility-administered medications prior to visit.     No Known  Allergies  ROS Review of Systems  Constitutional: Negative.   HENT: Negative.   Eyes: Negative.   Respiratory: Negative.   Cardiovascular: Negative.   Gastrointestinal: Negative.   Endocrine: Negative.   Genitourinary: Negative.   Musculoskeletal: Negative.   Skin: Positive for color change (longitudinal hyperpigmented segment on lateral edge of nail of great toe of L foot). Negative for pallor, rash and wound.  Allergic/Immunologic: Negative.   Neurological: Negative.   Hematological: Negative.   Psychiatric/Behavioral: Negative.   All other systems reviewed and are negative.     Objective:    Physical Exam  Constitutional: He is oriented to person, place, and time. He appears well-developed and well-nourished. No distress.  Cardiovascular: Normal rate and regular rhythm.  Pulmonary/Chest: Effort normal. No respiratory distress.  Neurological: He is alert and oriented to person, place, and  time.  Skin: Skin is warm and dry. No rash noted. He is not diaphoretic. No erythema. No pallor.     Psychiatric: He has a normal mood and affect. His behavior is normal. Judgment and thought content normal.  Nursing note and vitals reviewed.   BP 115/76   Pulse 69   Temp 98.7 F (37.1 C) (Oral)   Resp 18   Ht _0  (1.702 m)   Wt 149 lb 12.8 oz (67.9 kg)   SpO2 98%   BMI 23.46 kg/m  Wt Readings from Last 3 Encounters:  09/21/19 149 lb 12.8 oz (67.9 kg)  02/05/19 150 lb (68 kg)  10/02/18 149 lb (67.6 kg)     There are no preventive care reminders to display for this patient.  There are no preventive care reminders to display for this patient.  No results found for: TSH No results found for: WBC, HGB, HCT, MCV, PLT No results found for: NA, K, CHLORIDE, CO2, GLUCOSE, BUN, CREATININE, BILITOT, ALKPHOS, AST, ALT, PROT, ALBUMIN, CALCIUM, ANIONGAP, EGFR, GFR No results found for: CHOL No results found for: HDL No results found for: LDLCALC No results found for: TRIG No results found for: CHOLHDL No results found for: HGBA1C    Assessment & Plan:   Problem List Items Addressed This Visit    None    Visit Diagnoses    Melanonychia    -  Primary   Relevant Orders   Ambulatory referral to Dermatology      No orders of the defined types were placed in this encounter.   Follow-up: Return in about 6 months (around 03/20/2020) for CPE.   PLAN  Likely melanonychia given appearance and stability according to pt, but differentials include splinter hemorrhage.   Will refer to dermatology for assessment   Pt ideally will return in about 6 mos for CPE and labs.  Patient encouraged to call clinic with any questions, comments, or concerns.   Maximiano Coss, NP

## 2019-10-20 ENCOUNTER — Other Ambulatory Visit: Payer: Self-pay

## 2019-10-20 DIAGNOSIS — Z20822 Contact with and (suspected) exposure to covid-19: Secondary | ICD-10-CM

## 2019-10-22 LAB — NOVEL CORONAVIRUS, NAA: SARS-CoV-2, NAA: NOT DETECTED

## 2019-11-25 ENCOUNTER — Other Ambulatory Visit: Payer: BC Managed Care – PPO

## 2019-11-25 ENCOUNTER — Ambulatory Visit: Payer: BC Managed Care – PPO | Attending: Internal Medicine

## 2019-11-25 DIAGNOSIS — Z20822 Contact with and (suspected) exposure to covid-19: Secondary | ICD-10-CM

## 2019-11-27 LAB — NOVEL CORONAVIRUS, NAA: SARS-CoV-2, NAA: NOT DETECTED

## 2019-12-29 ENCOUNTER — Ambulatory Visit: Payer: BC Managed Care – PPO | Admitting: Registered Nurse

## 2019-12-31 ENCOUNTER — Other Ambulatory Visit: Payer: Self-pay

## 2019-12-31 ENCOUNTER — Other Ambulatory Visit (HOSPITAL_COMMUNITY)
Admission: RE | Admit: 2019-12-31 | Discharge: 2019-12-31 | Disposition: A | Discharge status: Home / Self Care | Payer: BC Managed Care – PPO | Source: Ambulatory Visit | Attending: Registered Nurse | Admitting: Registered Nurse

## 2019-12-31 ENCOUNTER — Encounter: Payer: Self-pay | Admitting: Registered Nurse

## 2019-12-31 ENCOUNTER — Ambulatory Visit (INDEPENDENT_AMBULATORY_CARE_PROVIDER_SITE_OTHER): Payer: BC Managed Care – PPO | Admitting: Registered Nurse

## 2019-12-31 VITALS — BP 113/77 | HR 67 | Temp 98.4°F | Ht 67.0 in | Wt 150.2 lb

## 2019-12-31 DIAGNOSIS — B009 Herpesviral infection, unspecified: Secondary | ICD-10-CM

## 2019-12-31 DIAGNOSIS — Z130 Encounter for screening for diseases of the blood and blood-forming organs and certain disorders involving the immune mechanism: Secondary | ICD-10-CM

## 2019-12-31 DIAGNOSIS — H547 Unspecified visual loss: Secondary | ICD-10-CM

## 2019-12-31 DIAGNOSIS — Z13220 Encounter for screening for lipoid disorders: Secondary | ICD-10-CM

## 2019-12-31 DIAGNOSIS — Z113 Encounter for screening for infections with a predominantly sexual mode of transmission: Secondary | ICD-10-CM | POA: Insufficient documentation

## 2019-12-31 DIAGNOSIS — R4589 Other symptoms and signs involving emotional state: Secondary | ICD-10-CM | POA: Diagnosis not present

## 2019-12-31 DIAGNOSIS — Z1329 Encounter for screening for other suspected endocrine disorder: Secondary | ICD-10-CM

## 2019-12-31 DIAGNOSIS — Z13228 Encounter for screening for other metabolic disorders: Secondary | ICD-10-CM

## 2019-12-31 DIAGNOSIS — Z0001 Encounter for general adult medical examination with abnormal findings: Secondary | ICD-10-CM | POA: Diagnosis not present

## 2019-12-31 DIAGNOSIS — Z0000 Encounter for general adult medical examination without abnormal findings: Secondary | ICD-10-CM

## 2019-12-31 MED ORDER — VALACYCLOVIR HCL 500 MG PO TABS: 500.0000 mg | ORAL_TABLET | Freq: Every day | ORAL | 4 refills | Status: DC

## 2019-12-31 NOTE — Patient Instructions (Addendum)
   If you have lab work done today you will be contacted with your lab results within the next 2 weeks.  If you have not heard from us then please contact us. The fastest way to get your results is to register for My Chart.   IF you received an x-ray today, you will receive an invoice from [facility]. Please contact [facility] at [phone] with questions or concerns regarding your invoice.   IF you received labwork today, you will receive an invoice from [facility]. Please contact [facility] at [phone] with questions or concerns regarding your invoice.   Our billing staff will not be able to assist you with questions regarding bills from these companies.  You will be contacted with the lab results as soon as they are available. The fastest way to get your results is to activate your My Chart account. Instructions are located on the last page of this paperwork. If you have not heard from us regarding the results in 2 weeks, please contact this office.      Health Maintenance, Male Adopting a healthy lifestyle and getting preventive care are important in promoting health and wellness. Ask your health care provider about:  The right schedule for you to have regular tests and exams.  Things you can do on your own to prevent diseases and keep yourself healthy. What should I know about diet, weight, and exercise? Eat a healthy diet   Eat a diet that includes plenty of vegetables, fruits, low-fat dairy products, and lean protein.  Do not eat a lot of foods that are high in solid fats, added sugars, or sodium. Maintain a healthy weight Body mass index (BMI) is a measurement that can be used to identify possible weight problems. It estimates body fat based on height and weight. Your health care provider can help determine your BMI and help you achieve or maintain a healthy weight. Get regular exercise Get regular exercise. This is one of the most important things you  can do for your health. Most adults should:  Exercise for at least 150 minutes each week. The exercise should increase your heart rate and make you sweat (moderate-intensity exercise).  Do strengthening exercises at least twice a week. This is in addition to the moderate-intensity exercise.  Spend less time sitting. Even light physical activity can be beneficial. Watch cholesterol and blood lipids Have your blood tested for lipids and cholesterol at 26 years of age, then have this test every 5 years. You may need to have your cholesterol levels checked more often if:  Your lipid or cholesterol levels are high.  You are older than 26 years of age.  You are at high risk for heart disease. What should I know about cancer screening? Many types of cancers can be detected early and may often be prevented. Depending on your health history and family history, you may need to have cancer screening at various ages. This may include screening for:  Colorectal cancer.  Prostate cancer.  Skin cancer.  Lung cancer. What should I know about heart disease, diabetes, and high blood pressure? Blood pressure and heart disease  High blood pressure causes heart disease and increases the risk of stroke. This is more likely to develop in people who have high blood pressure readings, are of African descent, or are overweight.  Talk with your health care provider about your target blood pressure readings.  Have your blood pressure checked: ? Every 3-5 years if you are 18-39   years of age. ? Every year if you are 11 years old or older.  If you are between the ages of 63 and 72 and are a current or former smoker, ask your health care provider if you should have a one-time screening for abdominal aortic aneurysm (AAA). Diabetes Have regular diabetes screenings. This checks your fasting blood sugar level. Have the screening done:  Once every three years after age 60 if you are at a normal weight and have  a low risk for diabetes.  More often and at a younger age if you are overweight or have a high risk for diabetes. What should I know about preventing infection? Hepatitis B If you have a higher risk for hepatitis B, you should be screened for this virus. Talk with your health care provider to find out if you are at risk for hepatitis B infection. Hepatitis C Blood testing is recommended for:  Everyone born from [birth year] through 1967.  Anyone with known risk factors for hepatitis C. Sexually transmitted infections (STIs)  You should be screened each year for STIs, including gonorrhea and chlamydia, if: ? You are sexually active and are younger than 26 years of age. ? You are older than 26 years of age and your health care provider tells you that you are at risk for this type of infection. ? Your sexual activity has changed since you were last screened, and you are at increased risk for chlamydia or gonorrhea. Ask your health care provider if you are at risk.  Ask your health care provider about whether you are at high risk for HIV. Your health care provider may recommend a prescription medicine to help prevent HIV infection. If you choose to take medicine to prevent HIV, you should first get tested for HIV. You should then be tested every 3 months for as long as you are taking the medicine. Follow these instructions at home: Lifestyle  Do not use any products that contain nicotine or tobacco, such as cigarettes, e-cigarettes, and chewing tobacco. If you need help quitting, ask your health care provider.  Do not use street drugs.  Do not share needles.  Ask your health care provider for help if you need support or information about quitting drugs. Alcohol use  Do not drink alcohol if your health care provider tells you not to drink.  If you drink alcohol: ? Limit how much you have to 0-2 drinks a day. ? Be aware of how much alcohol is in your drink. In the [place] one drink equals one 12  oz bottle of beer (355 mL), one 5 oz glass of wine (148 mL), or one 1 oz glass of hard liquor (44 mL). General instructions  Schedule regular health, dental, and eye exams.  Stay current with your vaccines.  Tell your health care provider if: ? You often feel depressed. ? You have ever been abused or do not feel safe at home. Summary  Adopting a healthy lifestyle and getting preventive care are important in promoting health and wellness.  Follow your health care provider's instructions about healthy diet, exercising, and getting tested or screened for diseases.  Follow your health care provider's instructions on monitoring your cholesterol and blood pressure. This information is not intended to replace advice given to you by your health care provider. Make sure you discuss any questions you have with your health care provider. Document Revised: 10/28/2018 Document Reviewed: 10/28/2018 Elsevier Patient Education  2020 [employer].     Why follow  it? Research shows. . Those who follow the Mediterranean diet have a reduced risk of heart disease  . The diet is associated with a reduced incidence of Parkinson's and Alzheimer's diseases . People following the diet may have longer life expectancies and lower rates of chronic diseases  . The Dietary Guidelines for Americans recommends the Mediterranean diet as an eating plan to promote health and prevent disease  What Is the Mediterranean Diet?  . Healthy eating plan based on typical foods and recipes of Mediterranean-style cooking . The diet is primarily a plant based diet; these foods should make up a majority of meals   Starches - Plant based foods should make up a majority of meals - They are an important sources of vitamins, minerals, energy, antioxidants, and fiber - Choose whole grains, foods high in fiber and minimally processed items  - Typical grain sources include wheat, oats, barley, corn, brown rice, bulgar, farro, millet,  polenta, couscous  - Various types of beans include chickpeas, lentils, fava beans, black beans, white beans   Fruits  Veggies - Large quantities of antioxidant rich fruits & veggies; 6 or more servings  - Vegetables can be eaten raw or lightly drizzled with oil and cooked  - Vegetables common to the traditional Mediterranean Diet include: artichokes, arugula, beets, broccoli, brussel sprouts, cabbage, carrots, celery, collard greens, cucumbers, eggplant, kale, leeks, lemons, lettuce, mushrooms, okra, onions, peas, peppers, potatoes, pumpkin, radishes, rutabaga, shallots, spinach, sweet potatoes, turnips, zucchini - Fruits common to the Mediterranean Diet include: apples, apricots, avocados, cherries, clementines, dates, figs, grapefruits, grapes, melons, nectarines, oranges, peaches, pears, pomegranates, strawberries, tangerines  Fats - Replace butter and margarine with healthy oils, such as olive oil, canola oil, and tahini  - Limit nuts to no more than a handful a day  - Nuts include walnuts, almonds, pecans, pistachios, pine nuts  - Limit or avoid candied, honey roasted or heavily salted nuts - Olives are central to the [employer] - can be eaten whole or used in a variety of dishes   Meats Protein - Limiting red meat: no more than a few times a month - When eating red meat: choose lean cuts and keep the portion to the size of deck of cards - Eggs: approx. 0 to 4 times a week  - Fish and lean poultry: at least 2 a week  - Healthy protein sources include, chicken, [place], lean beef, lamb - Increase intake of seafood such as tuna, salmon, trout, mackerel, shrimp, scallops - Avoid or limit high fat processed meats such as sausage and bacon  Dairy - Include moderate amounts of low fat dairy products  - Focus on healthy dairy such as fat free yogurt, skim milk, low or reduced fat cheese - Limit dairy products higher in fat such as whole or 2% milk, cheese, ice cream  Alcohol - Moderate  amounts of red wine is ok  - No more than 5 oz daily for women (all ages) and men older than age 79  - No more than 10 oz of wine daily for men younger than 68  Other - Limit sweets and other desserts  - Use herbs and spices instead of salt to flavor foods  - Herbs and spices common to the traditional Mediterranean Diet include: basil, bay leaves, chives, cloves, cumin, fennel, garlic, lavender, marjoram, mint, oregano, parsley, pepper, rosemary, sage, savory, sumac, tarragon, thyme   It's not just a diet, it's a lifestyle:  . The Mediterranean diet includes lifestyle  factors typical of those in the region  . Foods, drinks and meals are best eaten with others and savored . Daily physical activity is important for overall good health . This could be strenuous exercise like running and aerobics . This could also be more leisurely activities such as walking, housework, yard-work, or taking the stairs . Moderation is the key; a balanced and healthy diet accommodates most foods and drinks . Consider portion sizes and frequency of consumption of certain foods   Meal Ideas & Options:  . Breakfast:  o Whole wheat toast or whole wheat English muffins with peanut butter & hard boiled egg o Steel cut oats topped with apples & cinnamon and skim milk  o Fresh fruit: banana, strawberries, melon, berries, peaches  o Smoothies: strawberries, bananas, greek yogurt, peanut butter o Low fat greek yogurt with blueberries and granola  o Egg white omelet with spinach and mushrooms o Breakfast couscous: whole wheat couscous, apricots, skim milk, cranberries  . Sandwiches:  o Hummus and grilled vegetables (peppers, zucchini, squash) on whole wheat bread   o Grilled chicken on whole wheat pita with lettuce, tomatoes, cucumbers or tzatziki  o Tuna salad on whole wheat bread: tuna salad made with greek yogurt, olives, red peppers, capers, green onions o Garlic rosemary lamb pita: lamb sauted with garlic,  rosemary, salt & pepper; add lettuce, cucumber, greek yogurt to pita - flavor with lemon juice and black pepper  . Seafood:  o Mediterranean grilled salmon, seasoned with garlic, basil, parsley, lemon juice and black pepper o Shrimp, lemon, and spinach whole-grain pasta salad made with low fat greek yogurt  o Seared scallops with lemon orzo  o Seared tuna steaks seasoned salt, pepper, coriander topped with tomato mixture of olives, tomatoes, olive oil, minced garlic, parsley, green onions and cappers  . Meats:  o Herbed greek chicken salad with kalamata olives, cucumber, feta  o Red bell peppers stuffed with spinach, bulgur, lean ground beef (or lentils) & topped with feta   o Kebabs: skewers of chicken, tomatoes, onions, zucchini, squash  o [place] burgers: made with red onions, mint, dill, lemon juice, feta cheese topped with roasted red peppers . Vegetarian o Cucumber salad: cucumbers, artichoke hearts, celery, red onion, feta cheese, tossed in olive oil & lemon juice  o Hummus and whole grain pita points with a greek salad (lettuce, tomato, feta, olives, cucumbers, red onion) o Lentil soup with celery, carrots made with vegetable broth, garlic, salt and pepper  o Tabouli salad: parsley, bulgur, mint, scallions, cucumbers, tomato, radishes, lemon juice, olive oil, salt and pepper.       Fat and Cholesterol Restricted Eating Plan Eating a diet that limits fat and cholesterol may help lower your risk for heart disease and other conditions. Your body needs fat and cholesterol for basic functions, but eating too much of these things can be harmful to your health. Your health care provider may order lab tests to check your blood fat (lipid) and cholesterol levels. This helps your health care provider understand your risk for certain conditions and whether you need to make diet changes. Work with your health care provider or dietitian to make an eating plan that is right for you. Your plan  includes:  Limit your fat intake to ______% or less of your total calories a day.  Limit your saturated fat intake to ______% or less of your total calories a day.  Limit the amount of cholesterol in your diet to less than _________mg  a day.  Eat ___________ g of fiber a day. What are tips for following this plan? General guidelines   If you are overweight, work with your health care provider to lose weight safely. Losing just 5-10% of your body weight can improve your overall health and help prevent diseases such as diabetes and heart disease.  Avoid: ? Foods with added sugar. ? Fried foods. ? Foods that contain partially hydrogenated oils, including stick margarine, some tub margarines, cookies, crackers, and other baked goods.  Limit alcohol intake to no more than 1 drink a day for nonpregnant women and 2 drinks a day for men. One drink equals 12 oz of beer, 5 oz of wine, or 1 oz of hard liquor. Reading food labels  Check food labels for: ? Trans fats, partially hydrogenated oils, or high amounts of saturated fat. Avoid foods that contain saturated fat and trans fat. ? The amount of cholesterol in each serving. Try to eat no more than 200 mg of cholesterol each day. ? The amount of fiber in each serving. Try to eat at least 20-30 g of fiber each day.  Choose foods with healthy fats, such as: ? Monounsaturated and polyunsaturated fats. These include olive and canola oil, flaxseeds, walnuts, almonds, and seeds. ? Omega-3 fats. These are found in foods such as salmon, mackerel, sardines, tuna, flaxseed oil, and ground flaxseeds.  Choose grain products that have whole grains. Look for the word "whole" as the first word in the ingredient list. Cooking  Cook foods using methods other than frying. Baking, boiling, grilling, and broiling are some healthy options.  Eat more home-cooked food and less restaurant, buffet, and fast food.  Avoid cooking using saturated fats. ? Animal  sources of saturated fats include meats, butter, and cream. ? Plant sources of saturated fats include palm oil, palm kernel oil, and coconut oil. Meal planning   At meals, imagine dividing your plate into fourths: ? Fill one-half of your plate with vegetables and green salads. ? Fill one-fourth of your plate with whole grains. ? Fill one-fourth of your plate with lean protein foods.  Eat fish that is high in omega-3 fats at least two times a week.  Eat more foods that contain fiber, such as whole grains, beans, apples, broccoli, carrots, peas, and barley. These foods help promote healthy cholesterol levels in the blood. Recommended foods Grains  Whole grains, such as whole wheat or whole grain breads, crackers, cereals, and pasta. Unsweetened oatmeal, bulgur, barley, quinoa, or brown rice. Corn or whole wheat flour tortillas. Vegetables  Fresh or frozen vegetables (raw, steamed, roasted, or grilled). Green salads. Fruits  All fresh, canned (in natural juice), or frozen fruits. Meats and other protein foods  Ground beef (85% or leaner), grass-fed beef, or beef trimmed of fat. Skinless chicken or [place]. Ground chicken or [place]. Pork trimmed of fat. All fish and seafood. Egg whites. Dried beans, peas, or lentils. Unsalted nuts or seeds. Unsalted canned beans. Natural nut butters without added sugar and oil. Dairy  Low-fat or nonfat dairy products, such as skim or 1% milk, 2% or reduced-fat cheeses, low-fat and fat-free ricotta or cottage cheese, or plain low-fat and nonfat yogurt. Fats and oils  Tub margarine without trans fats. Light or reduced-fat mayonnaise and salad dressings. Avocado. Olive, canola, sesame, or safflower oils. The items listed above may not be a complete list of recommended foods or beverages. Contact your dietitian for more options. Foods to avoid Grains  White bread. White pasta.  White rice. Cornbread. Bagels, pastries, and croissants. Crackers and snack  foods that contain trans fat and hydrogenated oils. Vegetables  Vegetables cooked in cheese, cream, or butter sauce. Fried vegetables. Fruits  Canned fruit in heavy syrup. Fruit in cream or butter sauce. Fried fruit. Meats and other protein foods  Fatty cuts of meat. Ribs, chicken wings, bacon, sausage, bologna, salami, chitterlings, fatback, hot dogs, bratwurst, and packaged lunch meats. Liver and organ meats. Whole eggs and egg yolks. Chicken and [place] with skin. Fried meat. Dairy  Whole or 2% milk, cream, half-and-half, and cream cheese. Whole milk cheeses. Whole-fat or sweetened yogurt. Full-fat cheeses. Nondairy creamers and whipped toppings. Processed cheese, cheese spreads, and cheese curds. Beverages  Alcohol. Sugar-sweetened drinks such as sodas, lemonade, and fruit drinks. Fats and oils  Butter, stick margarine, lard, shortening, ghee, or bacon fat. Coconut, palm kernel, and palm oils. Sweets and desserts  Corn syrup, sugars, honey, and molasses. Candy. Jam and jelly. Syrup. Sweetened cereals. Cookies, pies, cakes, donuts, muffins, and ice cream. The items listed above may not be a complete list of foods and beverages to avoid. Contact your dietitian for more information. Summary  Your body needs fat and cholesterol for basic functions. However, eating too much of these things can be harmful to your health.  Work with your health care provider and dietitian to follow a diet low in fat and cholesterol. Doing this may help lower your risk for heart disease and other conditions.  Choose healthy fats, such as monounsaturated and polyunsaturated fats, and foods high in omega-3 fatty acids.  Eat fiber-rich foods, such as whole grains, beans, peas, fruits, and vegetables.  Limit or avoid alcohol, fried foods, and foods high in saturated fats, partially hydrogenated oils, and sugar. This information is not intended to replace advice given to you by your health care provider. Make  sure you discuss any questions you have with your health care provider. Document Revised: 10/17/2017 Document Reviewed: 07/22/2017 Elsevier Patient Education  [facility]) Exercise Recommendation  Being physically active is important to prevent heart disease and stroke, the nation's No. 1and No. 5killers. To improve overall cardiovascular health, we suggest at least 150 minutes per week of moderate exercise or 75 minutes per week of vigorous exercise (or a combination of moderate and vigorous activity). Thirty minutes a day, five times a week is an easy goal to remember. You will also experience benefits even if you divide your time into two or three segments of 10 to 15 minutes per day.  For people who would benefit from lowering their blood pressure or cholesterol, we recommend 40 minutes of aerobic exercise of moderate to vigorous intensity three to four times a week to lower the risk for heart attack and stroke.  Physical activity is anything that makes you move your body and burn calories.  This includes things like climbing stairs or playing sports. Aerobic exercises benefit your heart, and include walking, jogging, swimming or biking. Strength and stretching exercises are best for overall stamina and flexibility.  The simplest, positive change you can make to effectively improve your heart health is to start walking. It's enjoyable, free, easy, social and great exercise. A walking program is flexible and boasts high success rates because people can stick with it. It's easy for walking to become a regular and satisfying part of life.   For Overall Cardiovascular Health:  At least 30 minutes of moderate-intensity aerobic activity at least 5 days per  week for a total of 150  OR   At least 25 minutes of vigorous aerobic activity at least 3 days per week for a total of 75 minutes; or a combination of moderate- and vigorous-intensity aerobic  activity  AND   Moderate- to high-intensity muscle-strengthening activity at least 2 days per week for additional health benefits.  For Lowering Blood Pressure and Cholesterol  An average 40 minutes of moderate- to vigorous-intensity aerobic activity 3 or 4 times per week  What if I can't make it to the time goal? Something is always better than nothing! And everyone has to start somewhere. Even if you've been sedentary for years, today is the day you can begin to make healthy changes in your life. If you don't think you'll make it for 30 or 40 minutes, set a reachable goal for today. You can work up toward your overall goal by increasing your time as you get stronger. Don't let all-or-nothing thinking rob you of doing what you can every day.  Source:http://www.heart.org

## 2019-12-31 NOTE — Progress Notes (Signed)
Established Patient Office Visit  Subjective:  Patient ID: Antonio Decker, male    DOB: [birth year]  Age: 24 y.o. MRN: [id number]  CC:  Chief Complaint  Patient presents with  . Annual Exam    physical, also patient would like to know if there is any way he could get an everyday HSV medication    HPI [name] presents for CPE  Has a few concerns to address today:  STD Screening: No sxs. Hx of HSV. Has been getting outbreaks somewhat more frequently lately. Has been on valtrex in the past  - interested in starting on suppressive therapy. Hoping for full testing today as well. No new partners  Depressed mood: Interested in starting with a Social worker. Has not done this before. Not interested in medication. Denies HI/SI. No history of dx depression, but feels it has been a building issue for him  Decreased visual acuity: wears glasses/contacts. Feels that he can't see anything without them. Interested in [facility] - he believes he would be a good candidate.  Otherwise he feels well. 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   Past Medical History:  Diagnosis Date  . Allergy   . Asthma   . Seasonal allergies     No past surgical history on file.  Family History  Problem Relation Age of Onset  . Diabetes Maternal Grandmother   . Migraines Maternal Grandmother   . Migraines Father   . Asthma Sister   . Asthma Brother   . Cataracts Paternal Grandfather     Social History   Socioeconomic History  . Marital status: Single    Spouse name: Not on file  . Number of children: 0  . Years of education: Not on file  . Highest education level: Not on file  Occupational History  . Not on file  Tobacco Use  . Smoking status: Never Smoker  . Smokeless tobacco: Never Used  Substance and Sexual Activity  . Alcohol use: Yes    Alcohol/week: 3.0 - 4.0 standard drinks    Types: 3 - 4 Standard drinks or equivalent per week  . Drug use: No  .  Sexual activity: Yes    Partners: Female    Birth control/protection: Condom  Other Topics Concern  . Not on file  Social History Narrative   ** Merged History Encounter **       Social Determinants of Health   Financial Resource Strain: Low Risk   . Difficulty of Paying Living Expenses: Not hard at all  Food Insecurity: No Food Insecurity  . Worried About Charity fundraiser in the Last Year: Never true  . Ran Out of Food in the Last Year: Never true  Transportation Needs: No Transportation Needs  . Lack of Transportation (Medical): No  . Lack of Transportation (Non-Medical): No  Physical Activity: Insufficiently Active  . Days of Exercise per Week: 3 days  . Minutes of Exercise per Session: 30 min  Stress: No Stress Concern Present  . Feeling of Stress : Only a little  Social Connections: Unknown  . Frequency of Communication with Friends and Family: Three times a week  . Frequency of Social Gatherings with Friends and Family: Twice a week  . Attends Religious Services: Patient refused  . Active Member of Clubs or Organizations: Patient refused  . Attends Archivist Meetings: Patient refused  . Marital Status: Living with partner  Intimate Partner Violence: Not At Risk  . Fear  of Current or Ex-Partner: No  . Emotionally Abused: No  . Physically Abused: No  . Sexually Abused: No    Outpatient Medications Prior to Visit  Medication Sig Dispense Refill  . albuterol (VENTOLIN HFA) 108 (90 Base) MCG/ACT inhaler Inhale 2 puffs into the lungs every 6 (six) hours as needed for wheezing or shortness of breath. 16 g 0   No facility-administered medications prior to visit.    No Known Allergies  ROS Review of Systems  Constitutional: Negative.   HENT: Negative.   Eyes: Negative.   Respiratory: Negative.   Cardiovascular: Negative.   Gastrointestinal: Negative.   Endocrine: Negative.   Genitourinary: Negative.   Musculoskeletal: Negative.   Skin: Negative.     Allergic/Immunologic: Negative.   Neurological: Negative.   Hematological: Negative.   Psychiatric/Behavioral: Positive for dysphoric mood. Negative for self-injury, sleep disturbance and suicidal ideas. The patient is not nervous/anxious.   All other systems reviewed and are negative.     Objective:    Physical Exam  Constitutional: He is oriented to person, place, and time. He appears well-developed and well-nourished. No distress.  HENT:  Head: Normocephalic and atraumatic.  Right Ear: External ear normal.  Left Ear: External ear normal.  Nose: Nose normal.  Mouth/Throat: Oropharynx is clear and moist. No oropharyngeal exudate.  Eyes: Pupils are equal, round, and reactive to light. Conjunctivae and EOM are normal. Right eye exhibits no discharge. Left eye exhibits no discharge. No scleral icterus.  Neck: No tracheal deviation present. No thyromegaly present.  Cardiovascular: Normal rate, regular rhythm, normal heart sounds and intact distal pulses. Exam reveals no gallop.  No murmur heard. Pulmonary/Chest: Effort normal and breath sounds normal. No respiratory distress. He has no wheezes. He has no rales. He exhibits no tenderness.  Abdominal: Soft. Bowel sounds are normal. He exhibits no distension and no mass. There is no abdominal tenderness. There is no rebound and no guarding.  Musculoskeletal:        General: No tenderness, deformity or edema. Normal range of motion.     Cervical back: Normal range of motion.  Lymphadenopathy:    He has no cervical adenopathy.  Neurological: He is alert and oriented to person, place, and time. No cranial nerve deficit. He exhibits normal muscle tone. Coordination normal.  Skin: Skin is warm and dry. No rash noted. He is not diaphoretic. No erythema. No pallor.  Psychiatric: He has a normal mood and affect. His behavior is normal. Judgment and thought content normal.  Nursing note and vitals reviewed.   BP 113/77   Pulse 67   Temp 98.4  F (36.9 C) (Temporal)   Ht 5' 7"  (1.702 m)   Wt 150 lb 3.2 oz (68.1 kg)   SpO2 100%   BMI 23.52 kg/m  Wt Readings from Last 3 Encounters:  12/31/19 150 lb 3.2 oz (68.1 kg)  09/21/19 149 lb 12.8 oz (67.9 kg)  02/05/19 150 lb (68 kg)     There are no preventive care reminders to display for this patient.  There are no preventive care reminders to display for this patient.  No results found for: TSH No results found for: WBC, HGB, HCT, MCV, PLT No results found for: NA, K, CHLORIDE, CO2, GLUCOSE, BUN, CREATININE, BILITOT, ALKPHOS, AST, ALT, PROT, ALBUMIN, CALCIUM, ANIONGAP, EGFR, GFR No results found for: CHOL No results found for: HDL No results found for: LDLCALC No results found for: TRIG No results found for: CHOLHDL No results found for: HGBA1C  Assessment & Plan:   Problem List Items Addressed This Visit    None    Visit Diagnoses    HSV (herpes simplex virus) infection    -  Primary   Relevant Medications   valACYclovir (VALTREX) 500 MG tablet   Routine general medical examination at a health care facility       Depressed mood       Relevant Orders   Ambulatory referral to Psychology   Screen for STD (sexually transmitted disease)       Relevant Orders   Urine cytology ancillary only   RPR   HIV antibody (with reflex)   Screening for endocrine, metabolic and immunity disorder       Relevant Orders   TSH   CBC   Basic Metabolic Panel   Lipid screening       Relevant Orders   Lipid Panel      Meds ordered this encounter  Medications  . valACYclovir (VALTREX) 500 MG tablet    Sig: Take 1 tablet (500 mg total) by mouth daily.    Dispense:  90 tablet    Refill:  4    Order Specific Question:   Supervising Provider    Answer:   Forrest Moron [id number]    Follow-up: Return in 1 year (on 12/30/2020).   PLAN  Start valacyclovir 553m Po qd for suppressive HSV therapy  Labs drawn, will follow up as warranted  Referrals sent for ophthalmology  and behavioral health  Unremarkable exam  Return for CPE or sooner with any concerns  Patient encouraged to call clinic with any questions, comments, or concerns.  RMaximiano Coss NP

## 2020-01-01 LAB — BASIC METABOLIC PANEL
BUN/Creatinine Ratio: 10 (ref 9–20)
BUN: 13 mg/dL (ref 6–20)
CO2: 23 mmol/L (ref 20–29)
Calcium: 9.5 mg/dL (ref 8.7–10.2)
Chloride: 104 mmol/L (ref 96–106)
Creatinine, Ser: 1.26 mg/dL (ref 0.76–1.27)
GFR calc Af Amer: 91 mL/min/{1.73_m2} (ref >59)
GFR calc non Af Amer: 79 mL/min/{1.73_m2} (ref >59)
Glucose: 79 mg/dL (ref 65–99)
Potassium: 4.4 mmol/L (ref 3.5–5.2)
Sodium: 139 mmol/L (ref 134–144)

## 2020-01-01 LAB — LIPID PANEL
Chol/HDL Ratio: 3.1 ratio (ref 0.0–5.0)
Cholesterol, Total: 148 mg/dL (ref 100–199)
HDL: 47 mg/dL (ref >39)
LDL Chol Calc (NIH): 91 mg/dL (ref 0–99)
Triglycerides: 47 mg/dL (ref 0–149)
VLDL Cholesterol Cal: 10 mg/dL (ref 5–40)

## 2020-01-01 LAB — CBC
Hematocrit: 44.7 % (ref 37.5–51.0)
Hemoglobin: 15.3 g/dL (ref 13.0–17.7)
MCH: 30.1 pg (ref 26.6–33.0)
MCHC: 34.2 g/dL (ref 31.5–35.7)
MCV: 88 fL (ref 79–97)
Platelets: 278 10*3/uL (ref 150–450)
RBC: 5.08 x10E6/uL (ref 4.14–5.80)
RDW: 13.4 % (ref 11.6–15.4)
WBC: 4.4 10*3/uL (ref 3.4–10.8)

## 2020-01-01 LAB — TSH: TSH: 0.986 u[IU]/mL (ref 0.450–4.500)

## 2020-01-01 LAB — HIV ANTIBODY (ROUTINE TESTING W REFLEX): HIV Screen 4th Generation wRfx: NONREACTIVE

## 2020-01-01 LAB — RPR: RPR Ser Ql: NONREACTIVE

## 2020-01-03 ENCOUNTER — Other Ambulatory Visit: Payer: BC Managed Care – PPO

## 2020-01-03 ENCOUNTER — Encounter: Payer: Self-pay | Admitting: Registered Nurse

## 2020-01-03 LAB — URINE CYTOLOGY ANCILLARY ONLY
Chlamydia: NEGATIVE
Comment: NEGATIVE
Comment: NEGATIVE
Comment: NORMAL
Neisseria Gonorrhea: NEGATIVE
Trichomonas: NEGATIVE

## 2020-01-03 NOTE — Progress Notes (Signed)
Labs wnl Letter sent via [place], NP

## 2020-01-10 ENCOUNTER — Ambulatory Visit: Payer: BC Managed Care – PPO | Attending: Internal Medicine

## 2020-01-10 DIAGNOSIS — Z20822 Contact with and (suspected) exposure to covid-19: Secondary | ICD-10-CM

## 2020-01-11 LAB — NOVEL CORONAVIRUS, NAA: SARS-CoV-2, NAA: NOT DETECTED

## 2020-01-17 ENCOUNTER — Ambulatory Visit: Payer: BC Managed Care – PPO | Attending: Internal Medicine

## 2020-01-17 DIAGNOSIS — Z20822 Contact with and (suspected) exposure to covid-19: Secondary | ICD-10-CM

## 2020-01-18 LAB — NOVEL CORONAVIRUS, NAA: SARS-CoV-2, NAA: NOT DETECTED

## 2020-01-25 ENCOUNTER — Ambulatory Visit: Payer: BC Managed Care – PPO | Admitting: Psychology

## 2020-02-08 ENCOUNTER — Other Ambulatory Visit: Payer: Self-pay | Admitting: Orthopedic Surgery

## 2020-06-19 ENCOUNTER — Other Ambulatory Visit: Payer: BC Managed Care – PPO

## 2020-11-14 ENCOUNTER — Encounter: Payer: Self-pay | Admitting: Registered Nurse

## 2021-01-24 ENCOUNTER — Other Ambulatory Visit: Payer: Self-pay

## 2021-01-24 ENCOUNTER — Ambulatory Visit: Payer: BC Managed Care – PPO | Admitting: Registered Nurse

## 2021-01-24 ENCOUNTER — Encounter: Payer: Self-pay | Admitting: Registered Nurse

## 2021-01-24 ENCOUNTER — Other Ambulatory Visit (HOSPITAL_COMMUNITY)
Admission: RE | Admit: 2021-01-24 | Discharge: 2021-01-24 | Disposition: A | Discharge status: Home / Self Care | Payer: BC Managed Care – PPO | Source: Ambulatory Visit | Attending: Registered Nurse | Admitting: Registered Nurse

## 2021-01-24 VITALS — BP 115/83 | HR 63 | Temp 98.0°F | Resp 18 | Ht 67.0 in | Wt 148.8 lb

## 2021-01-24 DIAGNOSIS — M940 Chondrocostal junction syndrome [Tietze]: Secondary | ICD-10-CM | POA: Diagnosis not present

## 2021-01-24 DIAGNOSIS — C4371 Malignant melanoma of right lower limb, including hip: Secondary | ICD-10-CM | POA: Insufficient documentation

## 2021-01-24 DIAGNOSIS — Z113 Encounter for screening for infections with a predominantly sexual mode of transmission: Secondary | ICD-10-CM | POA: Insufficient documentation

## 2021-01-24 MED ORDER — DICLOFENAC SODIUM 75 MG PO TBEC: 75.0000 mg | DELAYED_RELEASE_TABLET | Freq: Two times a day (BID) | ORAL | 0 refills | Status: AC

## 2021-01-24 NOTE — Patient Instructions (Signed)
° ° ° °  If you have lab work done today you will be contacted with your lab results within the next 2 weeks.  If you have not heard from us then please contact us. The fastest way to get your results is to register for My Chart. ° ° °IF you received an x-ray today, you will receive an invoice from [facility]. Please contact [facility] at [phone] with questions or concerns regarding your invoice.  ° °IF you received labwork today, you will receive an invoice from [facility]. Please contact [facility] at [phone] with questions or concerns regarding your invoice.  ° °Our billing staff will not be able to assist you with questions regarding bills from these companies. ° °You will be contacted with the lab results as soon as they are available. The fastest way to get your results is to activate your My Chart account. Instructions are located on the last page of this paperwork. If you have not heard from us regarding the results in 2 weeks, please contact this office. °  ° ° ° °

## 2021-01-25 LAB — URINE CYTOLOGY ANCILLARY ONLY
Chlamydia: NEGATIVE
Comment: NEGATIVE
Comment: NEGATIVE
Comment: NORMAL
Neisseria Gonorrhea: NEGATIVE
Trichomonas: NEGATIVE

## 2021-01-25 LAB — RPR: RPR Ser Ql: NONREACTIVE

## 2021-01-25 LAB — HIV ANTIBODY (ROUTINE TESTING W REFLEX): HIV Screen 4th Generation wRfx: NONREACTIVE

## 2021-02-11 ENCOUNTER — Other Ambulatory Visit: Payer: Self-pay | Admitting: Registered Nurse

## 2021-02-11 DIAGNOSIS — B009 Herpesviral infection, unspecified: Secondary | ICD-10-CM

## 2021-04-02 ENCOUNTER — Encounter: Payer: Self-pay | Admitting: Registered Nurse

## 2021-09-28 NOTE — Progress Notes (Signed)
Established Patient Office Visit  Subjective:  Patient ID: Antonio Decker, male    DOB: Jul 24, 1996  Age: 25 y.o. MRN: [id number]  CC:  Chief Complaint  Patient presents with   Chest Pain    Patient states Antonio Decker has been experiencing chest pain from last Friday until Monday. Per patient the pain has went away unless Antonio Decker touch his chest. Also and routine Std screening.    HPI [name] presents for chest pain, STI, hx of melanoma  Chest pain Tenderness to touch, worse with movement Not bad on exertion, no lightheadedness, loc, visual changes, palpitations, claudication, or shob/doe Has not taken anything for relief. No acute injury or traum  STI screen No symptoms No known exposure Peace of mind testing.  Hx of melanoma S/p MOHS [place], no AE Has follow up scheduled with derm  Past Medical History:  Diagnosis Date   Allergy    Asthma    Seasonal allergies     No past surgical history on file.  Family History  Problem Relation Age of Onset   Diabetes Maternal Grandmother    Migraines Maternal Grandmother    Migraines Father    Asthma Sister    Asthma Brother    Cataracts Paternal Grandfather     Social History   Socioeconomic History   Marital status: Single    Spouse name: Not on file   Number of children: 0   Years of education: Not on file   Highest education level: Not on file  Occupational History   Not on file  Tobacco Use   Smoking status: Never   Smokeless tobacco: Never  Vaping Use   Vaping Use: Never used  Substance and Sexual Activity   Alcohol use: Yes    Alcohol/week: 3.0 - 4.0 standard drinks    Types: 3 - 4 Standard drinks or equivalent per week   Drug use: No   Sexual activity: Yes    Partners: Female    Birth control/protection: Condom  Other Topics Concern   Not on file  Social History Narrative   ** Merged History Encounter **       Social Determinants of Health   Financial Resource Strain: Not on file  Food Insecurity:  Not on file  Transportation Needs: Not on file  Physical Activity: Not on file  Stress: Not on file  Social Connections: Not on file  Intimate Partner Violence: Not on file    Outpatient Medications Prior to Visit  Medication Sig Dispense Refill   albuterol (VENTOLIN HFA) 108 (90 Base) MCG/ACT inhaler Inhale 2 puffs into the lungs every 6 (six) hours as needed for wheezing or shortness of breath. 16 g 0   valACYclovir (VALTREX) 500 MG tablet Take 1 tablet (500 mg total) by mouth daily. 90 tablet 4   No facility-administered medications prior to visit.    No Known Allergies  ROS Review of Systems  Constitutional: Negative.   HENT: Negative.    Eyes: Negative.   Respiratory: Negative.    Cardiovascular:  Positive for chest pain.  Gastrointestinal: Negative.   Endocrine: Negative.   Genitourinary: Negative.   Musculoskeletal: Negative.   Skin: Negative.   Allergic/Immunologic: Negative.   Neurological: Negative.   Hematological: Negative.   Psychiatric/Behavioral: Negative.    All other systems reviewed and are negative.    Objective:    Physical Exam Constitutional:      General: Antonio Decker is not in acute distress.    Appearance: Normal appearance. Antonio Decker is normal  weight. Antonio Decker is not ill-appearing, toxic-appearing or diaphoretic.  Cardiovascular:     Rate and Rhythm: Normal rate and regular rhythm.     Heart sounds: Normal heart sounds. No murmur heard.   No friction rub. No gallop.  Pulmonary:     Effort: Pulmonary effort is normal. No respiratory distress.     Breath sounds: Normal breath sounds. No stridor. No wheezing, rhonchi or rales.  Chest:     Chest wall: No tenderness.  Musculoskeletal:        General: Normal range of motion.     Comments: Mild tenderness to palpation on chest.  Skin:    General: Skin is warm and dry.  Neurological:     General: No focal deficit present.     Mental Status: Antonio Decker is alert and oriented to person, place, and time. Mental status is at  baseline.  Psychiatric:        Mood and Affect: Mood normal.        Behavior: Behavior normal.        Thought Content: Thought content normal.        Judgment: Judgment normal.    BP 115/83   Pulse 63   Temp 98 F (36.7 C) (Temporal)   Resp 18   Ht 5\' 7"  (1.702 m)   Wt 148 lb 12.8 oz (67.5 kg)   SpO2 99%   BMI 23.31 kg/m  Wt Readings from Last 3 Encounters:  01/24/21 148 lb 12.8 oz (67.5 kg)  12/31/19 150 lb 3.2 oz (68.1 kg)  09/21/19 149 lb 12.8 oz (67.9 kg)     Health Maintenance Due  Topic Date Due   COVID-19 Vaccine (1) Never done   Pneumococcal Vaccine 44-25 Years old (1 - PCV) Never done   INFLUENZA VACCINE  06/18/2021    There are no preventive care reminders to display for this patient.  Lab Results  Component Value Date   TSH 0.986 12/31/2019   Lab Results  Component Value Date   WBC 4.4 12/31/2019   HGB 15.3 12/31/2019   HCT 44.7 12/31/2019   MCV 88 12/31/2019   PLT 278 12/31/2019   Lab Results  Component Value Date   NA 139 12/31/2019   K 4.4 12/31/2019   CO2 23 12/31/2019   GLUCOSE 79 12/31/2019   BUN 13 12/31/2019   CREATININE 1.26 12/31/2019   CALCIUM 9.5 12/31/2019   Lab Results  Component Value Date   CHOL 148 12/31/2019   Lab Results  Component Value Date   HDL 47 12/31/2019   Lab Results  Component Value Date   LDLCALC 91 12/31/2019   Lab Results  Component Value Date   TRIG 47 12/31/2019   Lab Results  Component Value Date   CHOLHDL 3.1 12/31/2019   No results found for: HGBA1C    Assessment & Plan:   Problem List Items Addressed This Visit       Other   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    Dispense:  30 tablet    Refill:  0    Order Specific Question:   Supervising Provider    Answer:   Carlota Raspberry, JEFFREY R [[id number]]    Follow-up: No follow-ups on file.   PLAN Chest pain seems msk- likely costochondritis. Suggested OTC analgesic, diclofenac as above for breakthrough pain STI screening sent Continue to follow with derm Patient encouraged to call clinic with any questions, comments, or concerns.   Maximiano Coss, NP

## 2022-02-27 ENCOUNTER — Other Ambulatory Visit: Payer: Self-pay | Admitting: Registered Nurse

## 2022-02-27 DIAGNOSIS — B009 Herpesviral infection, unspecified: Secondary | ICD-10-CM
